# Patient Record
Sex: Female | Born: 1985 | Race: Black or African American | Hispanic: No | Marital: Single | Smoking: Current every day smoker
Health system: Southern US, Community
[De-identification: ages and names within clinical notes are randomized; demographics above are authoritative.]

## PROBLEM LIST (undated history)

## (undated) DIAGNOSIS — R569 Unspecified convulsions: Secondary | ICD-10-CM

## (undated) HISTORY — PX: OTHER SURGICAL HISTORY: SHX169

---
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
## 2004-01-29 ENCOUNTER — Inpatient Hospital Stay (HOSPITAL_COMMUNITY): Admission: AD | Admit: 2004-01-29 | Discharge: 2004-01-29 | Payer: Self-pay | Admitting: Obstetrics & Gynecology

## 2004-02-03 ENCOUNTER — Inpatient Hospital Stay (HOSPITAL_COMMUNITY): Admission: AD | Admit: 2004-02-03 | Discharge: 2004-02-03 | Payer: Self-pay | Admitting: *Deleted

## 2004-02-17 ENCOUNTER — Inpatient Hospital Stay (HOSPITAL_COMMUNITY): Admission: AD | Admit: 2004-02-17 | Discharge: 2004-02-21 | Payer: Self-pay | Admitting: Obstetrics and Gynecology

## 2004-02-17 ENCOUNTER — Ambulatory Visit: Payer: Self-pay | Admitting: Family Medicine

## 2004-02-18 ENCOUNTER — Encounter (INDEPENDENT_AMBULATORY_CARE_PROVIDER_SITE_OTHER): Payer: Self-pay | Admitting: Specialist

## 2004-11-02 ENCOUNTER — Ambulatory Visit (HOSPITAL_COMMUNITY): Admission: RE | Admit: 2004-11-02 | Discharge: 2004-11-02 | Payer: Self-pay | Admitting: *Deleted

## 2005-01-18 ENCOUNTER — Ambulatory Visit: Payer: Self-pay | Admitting: Family Medicine

## 2005-01-18 ENCOUNTER — Inpatient Hospital Stay (HOSPITAL_COMMUNITY): Admission: AD | Admit: 2005-01-18 | Discharge: 2005-01-18 | Payer: Self-pay | Admitting: *Deleted

## 2005-01-24 ENCOUNTER — Inpatient Hospital Stay (HOSPITAL_COMMUNITY): Admission: AD | Admit: 2005-01-24 | Discharge: 2005-01-27 | Payer: Self-pay | Admitting: *Deleted

## 2005-01-24 ENCOUNTER — Ambulatory Visit: Payer: Self-pay | Admitting: *Deleted

## 2005-01-25 ENCOUNTER — Encounter (INDEPENDENT_AMBULATORY_CARE_PROVIDER_SITE_OTHER): Payer: Self-pay | Admitting: Specialist

## 2005-01-31 ENCOUNTER — Inpatient Hospital Stay (HOSPITAL_COMMUNITY): Admission: AD | Admit: 2005-01-31 | Discharge: 2005-01-31 | Payer: Self-pay | Admitting: Obstetrics and Gynecology

## 2005-08-19 ENCOUNTER — Emergency Department (HOSPITAL_COMMUNITY): Admission: EM | Admit: 2005-08-19 | Discharge: 2005-08-20 | Payer: Self-pay | Admitting: Emergency Medicine

## 2005-12-30 ENCOUNTER — Emergency Department (HOSPITAL_COMMUNITY): Admission: EM | Admit: 2005-12-30 | Discharge: 2005-12-30 | Payer: Self-pay | Admitting: Family Medicine

## 2006-01-03 ENCOUNTER — Emergency Department (HOSPITAL_COMMUNITY): Admission: EM | Admit: 2006-01-03 | Discharge: 2006-01-03 | Payer: Self-pay | Admitting: Family Medicine

## 2006-04-18 DIAGNOSIS — E669 Obesity, unspecified: Secondary | ICD-10-CM

## 2006-09-30 ENCOUNTER — Encounter (INDEPENDENT_AMBULATORY_CARE_PROVIDER_SITE_OTHER): Payer: Self-pay | Admitting: *Deleted

## 2007-03-05 ENCOUNTER — Encounter (INDEPENDENT_AMBULATORY_CARE_PROVIDER_SITE_OTHER): Payer: Self-pay | Admitting: Family Medicine

## 2007-03-05 ENCOUNTER — Ambulatory Visit: Payer: Self-pay | Admitting: Family Medicine

## 2007-03-05 DIAGNOSIS — N898 Other specified noninflammatory disorders of vagina: Secondary | ICD-10-CM | POA: Insufficient documentation

## 2007-03-05 DIAGNOSIS — F17200 Nicotine dependence, unspecified, uncomplicated: Secondary | ICD-10-CM

## 2007-03-05 LAB — CONVERTED CEMR LAB

## 2007-03-06 ENCOUNTER — Telehealth (INDEPENDENT_AMBULATORY_CARE_PROVIDER_SITE_OTHER): Payer: Self-pay | Admitting: Family Medicine

## 2007-03-10 ENCOUNTER — Encounter (INDEPENDENT_AMBULATORY_CARE_PROVIDER_SITE_OTHER): Payer: Self-pay | Admitting: Family Medicine

## 2007-03-10 LAB — CONVERTED CEMR LAB: Pap Smear: NORMAL

## 2007-03-24 ENCOUNTER — Ambulatory Visit: Payer: Self-pay | Admitting: Family Medicine

## 2007-03-26 ENCOUNTER — Ambulatory Visit: Payer: Self-pay | Admitting: Family Medicine

## 2007-11-23 ENCOUNTER — Emergency Department (HOSPITAL_COMMUNITY): Admission: EM | Admit: 2007-11-23 | Discharge: 2007-11-23 | Payer: Self-pay | Admitting: Emergency Medicine

## 2007-12-27 ENCOUNTER — Emergency Department (HOSPITAL_COMMUNITY): Admission: EM | Admit: 2007-12-27 | Discharge: 2007-12-27 | Payer: Self-pay | Admitting: Emergency Medicine

## 2008-03-11 ENCOUNTER — Encounter: Payer: Self-pay | Admitting: Family Medicine

## 2008-03-11 ENCOUNTER — Other Ambulatory Visit: Admission: RE | Admit: 2008-03-11 | Discharge: 2008-03-11 | Payer: Self-pay | Admitting: Family Medicine

## 2008-03-11 ENCOUNTER — Ambulatory Visit: Payer: Self-pay | Admitting: Family Medicine

## 2008-03-11 LAB — CONVERTED CEMR LAB

## 2008-03-15 ENCOUNTER — Telehealth: Payer: Self-pay | Admitting: Family Medicine

## 2008-10-18 ENCOUNTER — Telehealth: Payer: Self-pay | Admitting: Family Medicine

## 2008-10-18 ENCOUNTER — Ambulatory Visit: Payer: Self-pay | Admitting: Family Medicine

## 2008-10-20 ENCOUNTER — Encounter: Payer: Self-pay | Admitting: *Deleted

## 2008-11-26 ENCOUNTER — Emergency Department (HOSPITAL_COMMUNITY): Admission: EM | Admit: 2008-11-26 | Discharge: 2008-11-26 | Payer: Self-pay | Admitting: Family Medicine

## 2009-01-12 ENCOUNTER — Ambulatory Visit: Payer: Self-pay | Admitting: Obstetrics and Gynecology

## 2009-09-06 ENCOUNTER — Emergency Department (HOSPITAL_COMMUNITY): Admission: EM | Admit: 2009-09-06 | Discharge: 2009-09-06 | Payer: Self-pay | Admitting: Emergency Medicine

## 2010-02-09 ENCOUNTER — Encounter: Payer: Self-pay | Admitting: Family Medicine

## 2010-03-23 NOTE — Miscellaneous (Signed)
  Clinical Lists Changes  Problems: Removed problem of INTRAUTERINE CONTRACEPTIVE DEVICE (ICD-V25.1) Removed problem of ROUTINE GYNECOLOGICAL EXAMINATION (ICD-V72.31) Removed problem of CONTACT OR EXPOSURE TO OTHER VIRAL DISEASES (ICD-V01.79) Removed problem of SCREENING FOR MALIGNANT NEOPLASM OF THE CERVIX (ICD-V76.2) Removed problem of SCREENING, PULMONARY TUBERCULOSIS (ICD-V74.1) Removed problem of SCREENING FOR DIABETES MELLITUS (ICD-V77.1) Removed problem of SCREENING FOR LIPOID DISORDERS (ICD-V77.91) Removed problem of HEALTHY ADULT FEMALE (ICD-V70.0)

## 2010-04-12 ENCOUNTER — Encounter: Payer: Self-pay | Admitting: *Deleted

## 2010-05-06 LAB — POCT PREGNANCY, URINE: Preg Test, Ur: NEGATIVE

## 2010-05-06 LAB — POCT I-STAT, CHEM 8
Calcium, Ion: 1.14 mmol/L (ref 1.12–1.32)
Creatinine, Ser: 0.8 mg/dL (ref 0.4–1.2)
Glucose, Bld: 87 mg/dL (ref 70–99)
HCT: 39 % (ref 36.0–46.0)
Hemoglobin: 13.3 g/dL (ref 12.0–15.0)
TCO2: 24 mmol/L (ref 0–100)

## 2010-05-25 LAB — POCT URINALYSIS DIP (DEVICE)
Bilirubin Urine: NEGATIVE
Glucose, UA: NEGATIVE mg/dL
Ketones, ur: NEGATIVE mg/dL
Nitrite: NEGATIVE
Protein, ur: NEGATIVE mg/dL

## 2010-05-25 LAB — WET PREP, GENITAL

## 2010-05-25 LAB — GC/CHLAMYDIA PROBE AMP, GENITAL: Chlamydia, DNA Probe: NEGATIVE

## 2010-07-07 NOTE — Discharge Summary (Signed)
NAME:  Kelly Johns, Kelly Johns               ACCOUNT NO.:  [id number]   MEDICAL RECORD NO.:  [id number]          PATIENT TYPE:  INP   LOCATION:  9131                          [facility]   PHYSICIAN:  Tanya S. Shawnie Pons, M.D.   DATE OF BIRTH:  1985/09/29   DATE OF ADMISSION:  02/17/2004  DATE OF DISCHARGE:  02/21/2004                                 DISCHARGE SUMMARY   ADDENDUM:  On discharge date, abdominal incision for the low transverse C-  section looked clean and dry; no signs of infection; no redness, induration,  or erythema.  Staples were removed, Steri-Strips placed.  Also, the patient  had a pediatric appointment for her baby at [facility] on February 28, 2004 at 10:30 a.m. with Dr. [place].  Also, the patient was instructed to  have pelvic rest for 6 weeks and to return to [facility] in 6 weeks for  postpartum check.      AM/MEDQ  D:  02/21/2004  T:  02/21/2004  Job:  [id number]

## 2010-07-07 NOTE — Discharge Summary (Signed)
NAME:  Kelly Johns, Kelly Johns               ACCOUNT NO.:  [id number]   MEDICAL RECORD NO.:  [id number]          PATIENT TYPE:  INP   LOCATION:  9131                          [facility]   PHYSICIAN:  Tanya S. Shawnie Pons, M.D.   DATE OF BIRTH:  29-Aug-1985   DATE OF ADMISSION:  02/17/2004  DATE OF DISCHARGE:  02/21/2004                                 DISCHARGE SUMMARY   ADMITTING DIAGNOSES:  1.  Intrauterine pregnancy at 40 weeks 5 days.  2.  Active labor with failure to progress.  3.  Nonreassuring fetal heart tracing.  4.  Delivery of a viable infant by low transverse cesarean section.   DISCHARGE MEDICATIONS:  1.  Ibuprofen 600 mg tablets one tablet q.6h. p.r.n. for pain.  2.  Percocet 325/5 mg one tablet q.4-6h. p.r.n. for increased pain.  3.  Ortho Tri-Cyclen one tablet daily as per packet instructions.  4.  Prenatal vitamins per 2-3 months.   ADMISSION HISTORY:  This is a 25 year old G1 patient with an intrauterine  pregnancy at 40 weeks 5 days admitted in active labor.  The patient  presented with slow progression and was placed in low-dose Pitocin and had  an artificial rupture of membranes at 1915 with a thick meconium.  The  patient received amnioinfusion.  Fetal heart tracing started to show  variable decelerations that became severe.  The baby remained reactive and  decelerations improved with the use of oxygen and position changes; also  with amnioinfusion and by turning the Pitocin off.  A thin anterior cervical  lip remained when the presentation was at the level of +2 station.  There  was an attempt to make the patient push and decelerations became more severe  and delivery was felt necessary to be eminent.  The patient was given a  terbutaline shot to stop her contractions.  Recurrent variables remained and  became more severe in the 40s.  The patient was taken to the OR to attempt a  vacuum extraction but in the OR the fetal heart rate remained below 100 and  in the 60s  with no reactivity and a STAT C-section was performed.  Please  see procedure dictation.  During labor, the patient was placed on penicillin  because she was GBS positive.  Also was placed on ampicillin, gentamycin,  and clindamycin after the C-section.  The patient received a total of 72  hours of antibiotics postprocedure and she presented postpartum fever on  postpartum day #2 of 100.4 but she had a good recovery.  She had minimal  pain and tenderness on postpartum day #1.  By postpartum day #2 and  postpartum day #3 the patient presented a well, benign course.  Postpartum  day #3 the patient was feeing well, ambulating, minimal pain and lochia.  She was over 48 afebrile.  She had a positive bowel movement earlier that  day.  The patient had received her RhoGAM shot.  Her vital signs were  temperature 97.9, heart rate 56, respirations 18, blood pressure 108/63.  Her physical exam showed general:  No acute distress, comfortable,  ambulating.  Cardiovascular:  Regular rate and rhythm; no murmurs, rubs, or  gallops.  Lungs:  Clear to auscultation bilaterally.  Extremities:  No calf  tenderness, 1+ edema in ankles, trace edema in the lower extremities but  better than the day prior . Abdomen was soft, expected tenderness,  nondistended, positive bowel sounds.  Uterus a finger below the umbilicus.  Her female baby was doing great.  He was circumcised earlier on the date of  discharge and was bottle feeding.  For contraception, this patient is going  to use Ortho Tri-Cylen.  A prescription was provided.   LABORATORY DATA:  The patient had an admission hemoglobin of 11.4.  On  discharge day her hemoglobin was 8.9.  On December 31 on postpartum day #1,  her white blood cell count was 13.7.  On the day prior discharge, her  white blood cell count was 9.4.  The patient had blood cultures that were  negative x2.  This patient's blood type is A negative.  Had a RhoGAM shot at  28 weeks of pregnancy  and before discharge of the hospital.  She was RPR  nonreactive, rubella immune, and GBS positive.  Mother and baby were  discharged home in stable condition.      AM/MEDQ  D:  02/21/2004  T:  02/21/2004  Job:  [id number]

## 2010-07-07 NOTE — Op Note (Signed)
NAME:  Kelly Johns, Kelly Johns               ACCOUNT NO.:  [id number]   MEDICAL RECORD NO.:  [id number]          PATIENT TYPE:  MAT   LOCATION:  MATC                          [facility]  [facility]   PHYSICIAN:  Conni Elliot, M.D.DATE OF BIRTH:  Mar 16, 1985   DATE OF PROCEDURE:  01/24/2005  DATE OF DISCHARGE:                                 OPERATIVE REPORT   PREOPERATIVE DIAGNOSIS:  Prior cesarean section, in active labor at term.   POSTOPERATIVE DIAGNOSIS:  Prior cesarean section, in active labor at term.   OPERATION/PROCEDURE:  Low transverse cesarean section, repeat.   SURGEON:  Conni Elliot, M.D.   ANESTHESIA:  Spinal.   DESCRIPTION OF PROCEDURE:  With the patient under spinal anesthetic in the  supine __________ position, abdomen was prepped and draped in the sterile  fashion.   The abdomen was entered through the prior cesarean scar and the old scar was  removed.  The incision was made through the subcutaneous tissue and fascia.  Peritoneal cavity entered.  Bladder flap created.  It should be noted that  the bladder flap was extremely high and skin fully reflected and will  possibly create problems in future surgery.  Lower uterine segment  transverse incision was made.  Fluid was meconium-stained and child was  DeLee suctioned prior to delivery of the chest.  The remainder of the body  was delivered.  Cord was doubly clamped and cut.  The baby was handed to the  neonatologist in attendance.  Placenta was delivered spontaneously.  The  uterus, bladder flap and anterior peritoneum and fascia were examined and  closed in a __________ fashion.  Estimated blood loss was less than 800 mL.           ______________________________  Conni Elliot, M.D.     ASG/MEDQ  D:  01/25/2005  T:  01/25/2005  Job:  [id number]

## 2010-07-07 NOTE — Discharge Summary (Signed)
NAME:  Kelly Johns, Kelly Johns               ACCOUNT NO.:  [id number]   MEDICAL RECORD NO.:  [id number]          PATIENT TYPE:  INP   LOCATION:  9129                          [facility]   PHYSICIAN:  Conni Elliot, M.D.DATE OF BIRTH:  [birth year]   DATE OF ADMISSION:  01/24/2005  DATE OF DISCHARGE:  01/27/2005                                 DISCHARGE SUMMARY   ADMISSION DIAGNOSES:  1.  Multipara with previous cesarean section desirous of elective repeat.  2.  Active labor at 39 weeks.   DISCHARGE DIAGNOSES:  1.  Repeat low transverse cesarean section under spinal anesthesia.  2.  Viable term infant.  3.  Rh negative, __________ negative.  4.  Mild anemia secondary to iron deficiency and blood loss.   HISTORY:  This is a 25 year old G2, P1-0-0-1 at 39 weeks on presentation  with intact membranes, no bleeding and good fetal movement.  She had been  contracting for several hours.   PREGNANCY COURSE:  Late onset of prenatal care but no complications; started  care at 27 weeks.   MEDICATIONS:  Prenatal vitamins.   ALLERGIES:  None.   OBSTETRICAL HISTORY:  She had a primary low transverse cesarean section for  nonreassuring fetal heart rate in December 2005.   GYNECOLOGIC HISTORY:  GYN history significant for history of Chlamydia, L-  SIL on Pap smear.   MEDICAL HISTORY:  Noncontributory.   SURGERIES:  LTCS under epidural.   FAMILY HISTORY:  Noncontributory.   SOCIAL HISTORY:  Smoker.  Partner not involved.   SIGNIFICANT LABORATORIES:  Rh negative, platelets 188, GBS was positive  November 15, 1-hour glucose 71, rubella immune, HBsAg negative, syphilis  negative, HIV nonreactive, GC negative, Chlamydia negative.   HOSPITAL COURSE:  Admission vital signs:  Afebrile, BP 124/68.  Exam:  She  was in no distress with some discomfort during contractions; lungs clear to  auscultation; heart RRR; abdomen term size; extremities trace edema; digital  exam significant for being 5  cm, 80%, -2 station, fetal heart rate was  reactive in the 150s.  The patient did not desire a trial of labor so  proceeded to low transverse cesarean section which was uncomplicated with  blood loss less than 1000.  She was put on cefotetan for two doses postop  and her placenta was sent for pathology, it was a mature placenta with three  vessel cord.  She had no postoperative complications.  On hospital day #3  she remained afebrile, fundus was firm and involuting, her pain was well  controlled with Percocet, she was bottle-feeding, she was discharged home on  the following medications:  Prenatal vitamin one a day, iron 325 mg one p.o.  twice a day, Percocet 5/325 one q.4-6 h p.r.n., ibuprofen 600 mg one p.o.  q.6 h p.r.n.   Her plan was abstinence until she was able to get her IUD at 6 weeks.  Followup was to be 6 weeks at [facility].      [name], C.N.M.    ______________________________  Conni Elliot, M.D.    DP/MEDQ  D:  02/13/2005  T:  02/13/2005  Job:  [id number]

## 2010-07-07 NOTE — Op Note (Signed)
NAME:  Wissink, Olivette               ACCOUNT NO.:  [id number]   MEDICAL RECORD NO.:  [id number]          PATIENT TYPE:  WOC   LOCATION:  WOC                          FACILITY:  [facility]   PHYSICIAN:  Tanya S. Shawnie Pons, M.D.   DATE OF BIRTH:  07/22/1985   DATE OF PROCEDURE:  02/18/2004  DATE OF DISCHARGE:                                 OPERATIVE REPORT   PREOPERATIVE DIAGNOSIS:  Intrauterine pregnancy at term, nonreassuring fetal  size.   POSTOPERATIVE DIAGNOSIS:  Intrauterine pregnancy at term, nonreassuring  fetal size.   OPERATION PERFORMED:  Primary low transverse cesarean section.   SURGEON:  Shelbie Proctor. Shawnie Pons, M.D.   ASSISTANT:  __________   ANESTHESIA:  Epidural.   ANESTHESIOLOGIST:  Octaviano Glow. Pamalee Leyden, M.D.   ESTIMATED BLOOD LOSS:  1000 mL.   COMPLICATIONS:  None.   SPECIMENS:  Placenta to pathology.   FINDINGS:  Viable female infant.  Apgars 8 and 9, weight 5 pounds, 9 ounces.   INDICATIONS FOR PROCEDURE:  Briefly, the patient is a 25 year old gravida 1  who is 40 and 5/7 weeks on admission, the day prior to surgery.  She was  admitted in labor and had slow progression.  She was started on Pitocin  augmentation; however, after AROM, thick meconium was noted and infusion was  started, but variable decels were noted as well.  These became more severe  and then would improve with change in position, oxygen, fluid challenge and  Pitocin off.  The patient progressed to a very thin anterior lip with the  baby at a +2 to +3.  An attempt was made to push.  However, the recurrent  decels became so severe that it was felt that immediate delivery was  necessary.  The patient was then given Terbutaline to stop her contractions;  however, they continued to come with severe variables lasting one and a half  to two minutes to the 40s.  The patient was taken to the operating room  where initially vacuum extraction was going to be attempted; however, once  the patient got to the operating  room the infants heart rate never got above  100 and it was having decels again down to the 60s and emergent C-section  was done.   DESCRIPTION OF PROCEDURE:  The patient was in the operating room in supine  position with left lateral tilt.  Epidural anesthesia had already been dosed  back in the delivery room.  The abdomen was splashed with Betadine and she  was draped.  An Lavena Bullion was used to test analgesia which was good and a knife  was used to make a Pfannenstiel incision in the skin.  This was carried down  to underlying fascia which was incised lengthwise.  The rectus was then  separated.  Peritoneal cavity entered.  Bladder blade inserted and a knife  used to make a low transverse incision on the uterus.  Amniotic fluid cavity  was entered and a very thin, low transverse segment of the uterus; meconium  stained fluid was noted.  The infant was wedged in the pelvis and was pulled  up and delivered with __________ still attached.  The infant had a tight  nuchal cord with the cord also around the feet.  The cord was clamped times  two and infant taken away by Peds.  The infant was resuscitated by peds and  had a cry by about 30 seconds of age.  The infant received blow by oxygen.  Cord pH was obtained.  Cord blood was obtained.  The placenta was delivered.  The uterus cleaned with dry lap pads.  Edges of the uterine incision grasped  with ring forceps and the uterine incision closed with a locked running 0  Vicryl suture.  A second imbricating layer was then used for hemostasis as  well as scar control for hemostasis.  Tubes and ovaries were observed and  found to be normal.  Hemostasis was felt to be adequate.  The uterine  incision and the fascia closed with 0 Vicryl suture in a running fashion.  Subcutaneous tissue was irrigated and any bleeding was cauterized with  electrocautery.  The skin was closed using clips.  10 mL of 0.25% Marcaine  was then injected into the patient's  abdomen.  An abdominal film was then  taken to assure that no instruments, needles or laps had been left inside  the patient as the procedure was done without a count.  The patient  tolerated the procedure well.  She was taken to recovery room in stable  condition.  The weight of the infant was 5 pounds 9 ounces.      TSP/MEDQ  D:  02/18/2004  T:  02/18/2004  Job:  [id number]

## 2011-01-17 ENCOUNTER — Encounter: Payer: Self-pay | Admitting: Family Medicine

## 2011-01-18 ENCOUNTER — Encounter: Payer: Self-pay | Admitting: Family Medicine

## 2011-01-18 ENCOUNTER — Other Ambulatory Visit (HOSPITAL_COMMUNITY)
Admission: RE | Admit: 2011-01-18 | Discharge: 2011-01-18 | Disposition: A | Discharge status: Home / Self Care | Payer: Medicaid Other | Source: Ambulatory Visit | Attending: Family Medicine | Admitting: Family Medicine

## 2011-01-18 ENCOUNTER — Ambulatory Visit (INDEPENDENT_AMBULATORY_CARE_PROVIDER_SITE_OTHER): Payer: Medicaid Other | Admitting: Family Medicine

## 2011-01-18 VITALS — BP 138/84 | HR 77 | Temp 98.7°F | Ht 63.0 in | Wt 197.3 lb

## 2011-01-18 DIAGNOSIS — N898 Other specified noninflammatory disorders of vagina: Secondary | ICD-10-CM

## 2011-01-18 DIAGNOSIS — Z01419 Encounter for gynecological examination (general) (routine) without abnormal findings: Secondary | ICD-10-CM | POA: Insufficient documentation

## 2011-01-18 DIAGNOSIS — Z0000 Encounter for general adult medical examination without abnormal findings: Secondary | ICD-10-CM

## 2011-01-18 DIAGNOSIS — F17200 Nicotine dependence, unspecified, uncomplicated: Secondary | ICD-10-CM

## 2011-01-18 DIAGNOSIS — Z124 Encounter for screening for malignant neoplasm of cervix: Secondary | ICD-10-CM

## 2011-01-18 DIAGNOSIS — R569 Unspecified convulsions: Secondary | ICD-10-CM

## 2011-01-18 DIAGNOSIS — Z23 Encounter for immunization: Secondary | ICD-10-CM

## 2011-01-18 DIAGNOSIS — G40909 Epilepsy, unspecified, not intractable, without status epilepticus: Secondary | ICD-10-CM

## 2011-01-18 LAB — POCT WET PREP (WET MOUNT)

## 2011-01-18 NOTE — Patient Instructions (Signed)
It was good to see today I am getting some blood work as well some imaging of the brain because of your seizures. I will like to also refer you to neurologist. We will check your cervical cultures as well as do some blood work. If any of your labs are positive or concerning we will contact you If you have any particular questions or concerns please give [place] a call Come back to see me in 1 month Call with any questions,  God Bless Doree Albee MD

## 2011-01-21 ENCOUNTER — Encounter: Payer: Self-pay | Admitting: Family Medicine

## 2011-01-21 DIAGNOSIS — Z0000 Encounter for general adult medical examination without abnormal findings: Secondary | ICD-10-CM | POA: Insufficient documentation

## 2011-01-21 DIAGNOSIS — R569 Unspecified convulsions: Secondary | ICD-10-CM | POA: Insufficient documentation

## 2011-01-21 NOTE — Assessment & Plan Note (Signed)
Wet prep, GC/Chl today. Will treat pending results.

## 2011-01-21 NOTE — Progress Notes (Signed)
  Subjective:    Patient ID: Kelly Johns, female    DOB: [birth year], 25 y.o.   MRN: [id number]  HPI Pt is here for annual exam and complaint of seizure  Seizure: Pt reports 4-5 episodes of seizres this year. Pt states that episodes have happened in there sleep, that have been winessed by her boyfriend. Pt states that these episodes occurred while she lived in [place]. Pt states that she never had episodes of seizure prior to this. Pt states that she was evaluated for this in a hospital in [place] (pt unsure of hospital). Pt unsure if she was placed on medication for this. + bladder incontinence assd with these episodes, + tongue biting. + post ictal confusion. Pt states that most recent episode was 1 month ago. Pt denies any recent head trauma, systemic infections. No neurological deficits. No known hx/o seizure d/o in the family.No ETOH, illicit drug use. Pt does report daily marijuana use.   From an annual exam standpoint, pt is currently sexual active with her boyfriend (who still lives in [place] area). Pt does report remote hx/o STDs. None recently. Pt does report mild vaginal discharge.   Review of Systems See HPI     Objective:   Physical Exam Gen: up in chair, NAD HEENT: NCAT, EOMI, PERRL, no photophobia on funduscopic exam, TMs clear bilaterally CV: RRR, no murmurs auscultated PULM: CTAB, no wheezes, rales, rhoncii ABD: S/NT/+ bowel sounds  GU: normal external genitalia, + vaginal discharge EXT: 2+ peripheral pulses NEURO: CN II-XII grossly intact, no focal neurological deficits.    Assessment & Plan:

## 2011-01-21 NOTE — Assessment & Plan Note (Signed)
Pt still smoking. Not ready to quit.  

## 2011-01-21 NOTE — Assessment & Plan Note (Addendum)
Relatively broad differential for this. Will obtain head CT to rule out intracranial process. Wil also check baseline labs. Formal referral to neuro. Advised pt to avoid driving. ? Marijuana is nidus for this. 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 Will follow up in 1-2 months.

## 2011-01-21 NOTE — Assessment & Plan Note (Signed)
Pap obtained.

## 2011-01-22 ENCOUNTER — Other Ambulatory Visit: Payer: Medicaid Other

## 2011-01-26 ENCOUNTER — Ambulatory Visit
Admission: RE | Admit: 2011-01-26 | Discharge: 2011-01-26 | Disposition: A | Discharge status: Home / Self Care | Payer: Medicaid Other | Source: Ambulatory Visit | Attending: Family Medicine | Admitting: Family Medicine

## 2011-01-26 DIAGNOSIS — G40909 Epilepsy, unspecified, not intractable, without status epilepticus: Secondary | ICD-10-CM

## 2011-01-26 MED ORDER — IOHEXOL 300 MG/ML  SOLN
75.0000 mL | Freq: Once | INTRAMUSCULAR | Status: AC | PRN
  Administered 2011-01-26: 75 mL via INTRAVENOUS

## 2011-01-29 ENCOUNTER — Encounter: Payer: Self-pay | Admitting: Family Medicine

## 2011-02-15 ENCOUNTER — Ambulatory Visit: Payer: Medicaid Other | Admitting: Family Medicine

## 2011-06-04 ENCOUNTER — Emergency Department (HOSPITAL_COMMUNITY)
Admission: EM | Admit: 2011-06-04 | Discharge: 2011-06-04 | Disposition: A | Discharge status: Home / Self Care | Payer: Self-pay | Attending: Emergency Medicine | Admitting: Emergency Medicine

## 2011-06-04 ENCOUNTER — Telehealth: Payer: Self-pay | Admitting: Family Medicine

## 2011-06-04 ENCOUNTER — Encounter (HOSPITAL_COMMUNITY): Payer: Self-pay | Admitting: *Deleted

## 2011-06-04 DIAGNOSIS — R569 Unspecified convulsions: Secondary | ICD-10-CM

## 2011-06-04 DIAGNOSIS — R51 Headache: Secondary | ICD-10-CM | POA: Insufficient documentation

## 2011-06-04 DIAGNOSIS — R110 Nausea: Secondary | ICD-10-CM | POA: Insufficient documentation

## 2011-06-04 HISTORY — DX: Unspecified convulsions: R56.9

## 2011-06-04 MED ORDER — ACETAMINOPHEN-CODEINE #3 300-30 MG PO TABS
1.0000 | ORAL_TABLET | Freq: Once | ORAL | Status: AC
  Administered 2011-06-04: 1 via ORAL
  Filled 2011-06-04: qty 1

## 2011-06-04 MED ORDER — LORAZEPAM 1 MG PO TABS
1.0000 mg | ORAL_TABLET | Freq: Once | ORAL | Status: AC
  Administered 2011-06-04: 1 mg via ORAL
  Filled 2011-06-04: qty 1

## 2011-06-04 MED ORDER — ONDANSETRON 8 MG PO TBDP
8.0000 mg | ORAL_TABLET | Freq: Once | ORAL | Status: AC
  Administered 2011-06-04: 8 mg via ORAL
  Filled 2011-06-04: qty 1

## 2011-06-04 NOTE — ED Provider Notes (Addendum)
History     CSN: [id number]  Arrival date & time 06/04/11  1191   First MD Initiated Contact with Patient 06/04/11 [phone]      Chief Complaint  Patient presents with  . Seizures    (Consider location/radiation/quality/duration/timing/severity/associated sxs/prior treatment) HPI Comments: Pt with reported long standing isue with seizures, had one yesterday.  Apparently had one in sleep this AM, only recalls rousing with paramedics around her and broght here.  Pt knows she is at [facility].  She has a HA and soreness to left side of tongue.  Mild nausea.  She denies recent fevers, vomiting, diarrhea, cough, cold symptoms.  She lives with a room mate.  She sees [facility], had a CT scan recently, was referred to neurologist, but pt reports has not yet seen.    Patient is a 26 y.o. female presenting with seizures. The history is provided by the patient and medical records.  Seizures  Associated symptoms include headaches and nausea. Pertinent negatives include no sore throat, no chest pain, no vomiting and no diarrhea.    Past Medical History  Diagnosis Date  . Seizures     History reviewed. No pertinent past surgical history.  History reviewed. No pertinent family history.  History  Substance Use Topics  . Smoking status: Current Everyday Smoker -- 0.1 packs/day    Types: Cigarettes  . Smokeless tobacco: Not on file  . Alcohol Use: No    OB History    Grav Para Term Preterm Abortions TAB SAB Ect Mult Living                  Review of Systems  Constitutional: Negative for fever and chills.  HENT: Negative for congestion, sore throat and rhinorrhea.   Cardiovascular: Negative for chest pain.  Gastrointestinal: Positive for nausea. Negative for vomiting, abdominal pain, diarrhea and constipation.  Musculoskeletal: Negative for back pain.  Neurological: Positive for seizures and headaches.  All other systems reviewed and are negative.    Allergies  Chocolate  Home Medications    No current outpatient prescriptions on file.  BP 127/75  Pulse 76  Temp(Src) 98.2 F (36.8 C) (Oral)  Resp 17  SpO2 96%  Physical Exam  Nursing note and vitals reviewed. Constitutional: She is oriented to person, place, and time. She appears well-developed and well-nourished. No distress.  HENT:  Head: Normocephalic and atraumatic.  Mouth/Throat: Uvula is midline and mucous membranes are normal. No oropharyngeal exudate.    Eyes: Conjunctivae and EOM are normal. Pupils are equal, round, and reactive to light. No scleral icterus.  Neck: Normal range of motion. Neck supple. No tracheal deviation present.  Cardiovascular: Normal rate.   Pulmonary/Chest: Effort normal.  Abdominal: Soft. She exhibits no distension. There is no tenderness. There is no guarding.  Musculoskeletal: She exhibits no edema and no tenderness.  Neurological: She is alert and oriented to person, place, and time.  Skin: Skin is warm. No rash noted.  Psychiatric: She has a normal mood and affect.    ED Course  Procedures (including critical care time)   Labs Reviewed  PREGNANCY, URINE   No results found.   1. Seizure     9:30 AM No further seizures, spoke to [facility] resident who reports pt can be worked into schedule today and then they can discuss use of antiepileptics there.  Pt cautioned not to drive until cleared.  1 mg PO ativan given here.    MDM  Pt is A&O times 3 currently, no  focal deficits on neuro examination at present.  I reviewed prior notes, will get Urine pregnancy, treat HA and monitor, help facilitate follow up, will discuss with [facility].           Gavin Pound. Oletta Lamas, MD 06/04/11 [id number]  Gavin Pound. Lynnleigh Soden, MD 06/04/11 0930

## 2011-06-04 NOTE — ED Notes (Signed)
OZH:YQMV<HQ> Expected date:06/04/11<BR> Expected time: 6:35 AM<BR> Means of arrival:Ambulance<BR> Comments:<BR> Seizure activity

## 2011-06-04 NOTE — ED Notes (Signed)
Per EMS - pt recently dx w/ seizure disorder - pt has seen a physician for this however has not yet been prescribed medications. Pt experienced a seizure this a.m. approx 0630 - pt postictal on EMS arrival.

## 2011-06-04 NOTE — Telephone Encounter (Signed)
[facility] EDP called because patient in ED s/p seizure. Responded to ativan. Not currently on any seizure medication. I do not see in her notes what she was on before and It appears she has not seen neurology yet. I advised the EDP to have patient follow up with [place] for a work in appointment today so we can address neurology follow up and seizure medications.

## 2011-06-04 NOTE — Discharge Instructions (Signed)
Please contact Dr. Pascola Blas office today to be worked in as a patient today to discuss options regarding seizure medications.

## 2011-06-05 ENCOUNTER — Ambulatory Visit (INDEPENDENT_AMBULATORY_CARE_PROVIDER_SITE_OTHER): Payer: Self-pay | Admitting: Family Medicine

## 2011-06-05 ENCOUNTER — Encounter: Payer: Self-pay | Admitting: Family Medicine

## 2011-06-05 VITALS — BP 128/82 | HR 97 | Ht 63.0 in | Wt 193.2 lb

## 2011-06-05 DIAGNOSIS — G40909 Epilepsy, unspecified, not intractable, without status epilepticus: Secondary | ICD-10-CM

## 2011-06-05 DIAGNOSIS — R569 Unspecified convulsions: Secondary | ICD-10-CM

## 2011-06-05 LAB — COMPREHENSIVE METABOLIC PANEL
Albumin: 3.8 g/dL (ref 3.5–5.2)
BUN: 8 mg/dL (ref 6–23)
CO2: 28 mEq/L (ref 19–32)
Calcium: 9.3 mg/dL (ref 8.4–10.5)
Glucose, Bld: 76 mg/dL (ref 70–99)
Potassium: 3.6 mEq/L (ref 3.5–5.3)
Sodium: 142 mEq/L (ref 135–145)
Total Protein: 7 g/dL (ref 6.0–8.3)

## 2011-06-05 NOTE — Patient Instructions (Signed)
Thank you for coming in today. We are doing urine and blood work today.  Please try to decrease marijuana use. Also, avoid driving, taking baths, and swimming pools. Be sure to get 8 hours of regular sleep.   We are referring you for an EEG and an MRI of your head. We will let you know of the results.  If you develop any worsening seizure disorder, or any other concerning symptoms, please call us or go to the ED.

## 2011-06-05 NOTE — Progress Notes (Addendum)
Subjective:     Patient ID: Kelly Johns, female   DOB: 11-21-1985, 26 y.o.   MRN: [id number]  HPI  Discharged from ED yesterday following witnessed seizure at 6am at her friend's home. This is her second seizure in two consecutive nights during sleep. She does not recall questioning by EMS during her second seizure and 'woke up in the hospital'. She had a significant headache yesterday. Prior to each event, she had a marijuana binge. No other illicit drug use. She often goes to bed late watching TV.   She bit her tongue with bleeding, no urinary/fecal incontinence. Did not hit her head during event.   For past year, has had seizures in sleep, every 2 months or so. Went to neurology, who did a CT that was unremarkable.  Pt denies any trauma, rashes, infection, or hx/o trauma.  Review of Systems     Objective:   Physical Exam Gen: Well appearing, NAD HEENT: NCAT, EOMI, PERRLA, abrasions to left lateral tongue. CV: RRR, no m/r/g Lungs: CTAB, normal WOB Neuro: CN II-XII intact in detail. Sensation grossly intact. 5/5 strength and full ROM in all extremities.    Assessment:         Plan:     [name] consecutive seizures are concerning for a secondary cause. She is a heavy marijuana smoker, which lowers her seizure threshold. Prior to each event, she has had marijuana binges. She was seen by neurology with a negative CT. ED evaluated only for upreg yesterday.  Will order an EEG and MRI of the head to r/o intracranial process. Ordered UDS, CMP, and CBC.  Patient advised to abstain as much as she can from marijuana use as it may be a precipitator. She was also advised to attempt to sleep 8-9 hours/night. She was counseled to not drive, bathe, or swim in pools until we can isolate a cause.      Pt seen and examined with [facility] and agree with above assessment and plan.

## 2011-06-06 ENCOUNTER — Encounter: Payer: Self-pay | Admitting: Family Medicine

## 2011-06-06 LAB — DRUG SCREEN, URINE
Benzodiazepines.: NEGATIVE
Cocaine Metabolites: POSITIVE — AB
Creatinine,U: 582.55 mg/dL
Phencyclidine (PCP): NEGATIVE
Propoxyphene: NEGATIVE

## 2011-06-06 LAB — CBC WITH DIFFERENTIAL/PLATELET
Basophils Relative: 0 % (ref 0–1)
HCT: 36.2 % (ref 36.0–46.0)
Hemoglobin: 12.2 g/dL (ref 12.0–15.0)
Lymphs Abs: 3.3 10*3/uL (ref 0.7–4.0)
MCH: 32.1 pg (ref 26.0–34.0)
MCHC: 33.7 g/dL (ref 30.0–36.0)
Monocytes Absolute: 0.7 10*3/uL (ref 0.1–1.0)
Monocytes Relative: 8 % (ref 3–12)
Neutro Abs: 4.1 10*3/uL (ref 1.7–7.7)
Neutrophils Relative %: 50 % (ref 43–77)
RBC: 3.8 MIL/uL — ABNORMAL LOW (ref 3.87–5.11)

## 2011-06-06 LAB — RPR

## 2011-06-06 LAB — HIV ANTIBODY (ROUTINE TESTING W REFLEX): HIV: NONREACTIVE

## 2011-06-06 NOTE — Assessment & Plan Note (Addendum)
Etiology seems likely to be secondary to illicit drug use as well persistent fatigue. Still, pt has not had an extensive work up for this. Will check CBC, CMET, HIV, RPR. Will also obtain MRI brain as well as EEG. Discussed marijuana avoidance as well as proper sleep hygiene. Discussed safety precautions including driving, swimming, and bathing avoidance pending work up. No medications prescribed pending work up. Follow up in 1-2 weeks. Neuro red flags discussed.  Case reviewed with Dr. Perley Jain

## 2011-06-08 ENCOUNTER — Inpatient Hospital Stay: Admission: RE | Admit: 2011-06-08 | Payer: Self-pay | Source: Ambulatory Visit

## 2011-09-02 ENCOUNTER — Emergency Department (HOSPITAL_COMMUNITY)
Admission: EM | Admit: 2011-09-02 | Discharge: 2011-09-03 | Disposition: A | Discharge status: Home / Self Care | Payer: Medicaid Other | Attending: Emergency Medicine | Admitting: Emergency Medicine

## 2011-09-02 ENCOUNTER — Emergency Department (HOSPITAL_COMMUNITY): Payer: Medicaid Other

## 2011-09-02 ENCOUNTER — Encounter (HOSPITAL_COMMUNITY): Payer: Self-pay | Admitting: Emergency Medicine

## 2011-09-02 DIAGNOSIS — S0180XA Unspecified open wound of other part of head, initial encounter: Secondary | ICD-10-CM | POA: Insufficient documentation

## 2011-09-02 DIAGNOSIS — R569 Unspecified convulsions: Secondary | ICD-10-CM | POA: Insufficient documentation

## 2011-09-02 DIAGNOSIS — S0181XA Laceration without foreign body of other part of head, initial encounter: Secondary | ICD-10-CM

## 2011-09-02 DIAGNOSIS — W19XXXA Unspecified fall, initial encounter: Secondary | ICD-10-CM | POA: Insufficient documentation

## 2011-09-02 LAB — CBC WITH DIFFERENTIAL/PLATELET
Basophils Absolute: 0 10*3/uL (ref 0.0–0.1)
Eosinophils Relative: 0 % (ref 0–5)
Lymphocytes Relative: 14 % (ref 12–46)
MCV: 93.4 fL (ref 78.0–100.0)
Neutro Abs: 9.6 10*3/uL — ABNORMAL HIGH (ref 1.7–7.7)
Neutrophils Relative %: 79 % — ABNORMAL HIGH (ref 43–77)
Platelets: 217 10*3/uL (ref 150–400)
RBC: 3.61 MIL/uL — ABNORMAL LOW (ref 3.87–5.11)
RDW: 14.3 % (ref 11.5–15.5)
WBC: 12.2 10*3/uL — ABNORMAL HIGH (ref 4.0–10.5)

## 2011-09-02 LAB — COMPREHENSIVE METABOLIC PANEL
ALT: 13 U/L (ref 0–35)
AST: 20 U/L (ref 0–37)
Alkaline Phosphatase: 71 U/L (ref 39–117)
CO2: 23 mEq/L (ref 19–32)
Calcium: 8.9 mg/dL (ref 8.4–10.5)
GFR calc non Af Amer: 78 mL/min — ABNORMAL LOW (ref >90)
Potassium: 3.3 mEq/L — ABNORMAL LOW (ref 3.5–5.1)
Sodium: 141 mEq/L (ref 135–145)

## 2011-09-02 MED ORDER — ONDANSETRON HCL 4 MG/2ML IJ SOLN
INTRAMUSCULAR | Status: AC
  Filled 2011-09-02: qty 2

## 2011-09-02 MED ORDER — SODIUM CHLORIDE 0.9 % IV BOLUS (SEPSIS)
1000.0000 mL | Freq: Once | INTRAVENOUS | Status: AC
  Administered 2011-09-02: 1000 mL via INTRAVENOUS

## 2011-09-02 MED ORDER — ONDANSETRON HCL 4 MG/2ML IJ SOLN
4.0000 mg | Freq: Once | INTRAMUSCULAR | Status: AC
  Administered 2011-09-02: 4 mg via INTRAVENOUS

## 2011-09-02 MED ORDER — TETANUS-DIPHTH-ACELL PERTUSSIS 5-2.5-18.5 LF-MCG/0.5 IM SUSP
0.5000 mL | Freq: Once | INTRAMUSCULAR | Status: AC
  Administered 2011-09-02: 0.5 mL via INTRAMUSCULAR
  Filled 2011-09-02: qty 0.5

## 2011-09-02 NOTE — ED Notes (Signed)
Patient has known history of seizures, EMS states patient was staying at friends house when she had a seizure causing her to fall down 1/2 flight of stairs. Patient combative on scene. Given 5 of Versed and restrained. Pt currently drowsy, in room asleep. Vitals stable. BP 168/100. Patient has laceration under eye, bleeding controlled.

## 2011-09-02 NOTE — ED Provider Notes (Signed)
History     CSN: [id number]  Arrival date & time 09/02/11  2202   First MD Initiated Contact with Patient 09/02/11 2202      Chief Complaint  Patient presents with  . Seizures    (Consider location/radiation/quality/duration/timing/severity/associated sxs/prior treatment) Patient is a 26 y.o. female presenting with seizures. The history is provided by the EMS personnel.  Seizures  This is a recurrent problem. The current episode started less than 1 hour ago. The problem has been resolved. There was 1 seizure. Duration: unknown. Associated symptoms include patient experiences confusion and headaches. Pertinent negatives include no sore throat, no chest pain, no cough, no nausea, no vomiting and no diarrhea. Characteristics include rhythmic jerking and loss of consciousness. Characteristics do not include apnea. The episode was witnessed. The seizures did not continue in the ED. nothing known There has been no fever. Meds prior to arrival: versed.    Past Medical History  Diagnosis Date  . Seizures     History reviewed. No pertinent past surgical history.  History reviewed. No pertinent family history.  History  Substance Use Topics  . Smoking status: Current Everyday Smoker -- 0.3 packs/day    Types: Cigarettes  . Smokeless tobacco: Not on file  . Alcohol Use: No    OB History    Grav Para Term Preterm Abortions TAB SAB Ect Mult Living                  Review of Systems  Constitutional: Negative for fever, chills, diaphoresis and fatigue.  HENT: Negative for ear pain, congestion, sore throat, facial swelling, mouth sores, trouble swallowing, neck pain and neck stiffness.   Eyes: Negative.   Respiratory: Negative for apnea, cough, chest tightness, shortness of breath and wheezing.   Cardiovascular: Negative for chest pain, palpitations and leg swelling.  Gastrointestinal: Negative for nausea, vomiting, abdominal pain, diarrhea and abdominal distention.  Genitourinary:  Negative for hematuria, flank pain, vaginal discharge, difficulty urinating and menstrual problem.  Musculoskeletal: Negative for back pain and gait problem.  Skin: Negative for rash and wound.  Neurological: Positive for seizures, loss of consciousness and headaches. Negative for dizziness, tremors, syncope, facial asymmetry and numbness.  Psychiatric/Behavioral: Positive for confusion and agitation.  All other systems reviewed and are negative.    Allergies  Chocolate  Home Medications  No current outpatient prescriptions on file.  There were no vitals taken for this visit.  Physical Exam  Nursing note and vitals reviewed. Constitutional: She appears well-developed and well-nourished. No distress.  HENT:  Head: Normocephalic. Head is with laceration.    Right Ear: External ear normal.  Left Ear: External ear normal.  Nose: Nose normal.  Mouth/Throat: Oropharynx is clear and moist. No oropharyngeal exudate.  Eyes: Conjunctivae and EOM are normal. Pupils are equal, round, and reactive to light. Right eye exhibits no discharge. Left eye exhibits no discharge.  Neck: Normal range of motion. Neck supple. No JVD present. No tracheal deviation present. No thyromegaly present.  Cardiovascular: Normal rate, regular rhythm, normal heart sounds and intact distal pulses.  Exam reveals no gallop and no friction rub.   No murmur heard. Pulmonary/Chest: Effort normal and breath sounds normal. No respiratory distress. She has no wheezes. She has no rales. She exhibits no tenderness.  Abdominal: Soft. Bowel sounds are normal. She exhibits no distension. There is no tenderness. There is no rebound and no guarding.  Musculoskeletal: Normal range of motion.  Lymphadenopathy:    She has no cervical adenopathy.  Neurological: She is alert. No cranial nerve deficit. Coordination normal.       Patient appears confused  Skin: Skin is warm. No rash noted. She is not diaphoretic.  Psychiatric: She has  a normal mood and affect. Her behavior is normal. Judgment and thought content normal.    ED Course  LACERATION REPAIR Date/Time: 09/02/2011 11:57 PM Performed by: Sherryl Manges Authorized by: Sherryl Manges Consent: Verbal consent obtained. Risks and benefits: risks, benefits and alternatives were discussed Consent given by: patient and parent Patient understanding: patient states understanding of the procedure being performed Patient consent: the patient's understanding of the procedure matches consent given Procedure consent: procedure consent matches procedure scheduled Relevant documents: relevant documents present and verified Patient identity confirmed: arm band and hospital-assigned identification number Time out: Immediately prior to procedure a "time out" was called to verify the correct patient, procedure, equipment, support staff and site/side marked as required. Body area: head/neck Location details: right cheek Laceration length: 3 cm Foreign bodies: no foreign bodies Tendon involvement: none Nerve involvement: none Vascular damage: no Anesthesia: local infiltration Local anesthetic: lidocaine 2% with epinephrine Anesthetic total: 3 ml Patient sedated: no Preparation: Patient was prepped and draped in the usual sterile fashion. Irrigation solution: saline Irrigation method: jet lavage Amount of cleaning: extensive Debridement: none Degree of undermining: none Skin closure: 6-0 nylon Number of sutures: 6 Technique: simple Approximation: close Approximation difficulty: simple Patient tolerance: Patient tolerated the procedure well with no immediate complications.   (including critical care time)   Labs Reviewed  CBC WITH DIFFERENTIAL  COMPREHENSIVE METABOLIC PANEL  URINALYSIS, ROUTINE W REFLEX MICROSCOPIC   No results found.   No diagnosis found.    MDM  26 year old female patient with known seizure disorder presents with seizure. Patient has been  having seizures for the past 2 years has seen a neurologist and MRI and EEG. Patient has not been placed on antiseizure medications point time. Patient's last seizure was approximately 3 months ago. According to EMS patient was at a friend's house walking up stairs when she fell on the stairs and started having jerking movements and loss of consciousness. Patient is now postictal and confused complaining of headache and was combative in the transport of her car and 5 of Versed. Patient with no cranial nerve deficit appreciable exam was able follow commands move all extremities with normal strength no abdominal pain. Patient without any recent fevers chest pain shortness of breath. Patient was nauseated and had an episode of emesis in the room. Given the fall and emesis concern for possible intracranial pathology will get head CT. Also get CMP CBC urine labs. We'll update tetanus and repair laceration.  Results for orders placed during the hospital encounter of 09/02/11  CBC WITH DIFFERENTIAL      Component Value Range   WBC 12.2 (*) 4.0 - 10.5 K/uL   RBC 3.61 (*) 3.87 - 5.11 MIL/uL   Hemoglobin 11.5 (*) 12.0 - 15.0 g/dL   HCT 29.5 (*) 28.4 - 13.2 %   MCV 93.4  78.0 - 100.0 fL   MCH 31.9  26.0 - 34.0 pg   MCHC 34.1  30.0 - 36.0 g/dL   RDW 44.0  10.2 - 72.5 %   Platelets 217  150 - 400 K/uL   Neutrophils Relative 79 (*) 43 - 77 %   Neutro Abs 9.6 (*) 1.7 - 7.7 K/uL   Lymphocytes Relative 14  12 - 46 %   Lymphs Abs 1.7  0.7 - 4.0  K/uL   Monocytes Relative 7  3 - 12 %   Monocytes Absolute 0.8  0.1 - 1.0 K/uL   Eosinophils Relative 0  0 - 5 %   Eosinophils Absolute 0.1  0.0 - 0.7 K/uL   Basophils Relative 0  0 - 1 %   Basophils Absolute 0.0  0.0 - 0.1 K/uL  COMPREHENSIVE METABOLIC PANEL      Component Value Range   Sodium 141  135 - 145 mEq/L   Potassium 3.3 (*) 3.5 - 5.1 mEq/L   Chloride 105  96 - 112 mEq/L   CO2 23  19 - 32 mEq/L   Glucose, Bld 113 (*) 70 - 99 mg/dL   BUN 8  6 - 23 mg/dL     Creatinine, Ser 4.54  0.50 - 1.10 mg/dL   Calcium 8.9  8.4 - 09.8 mg/dL   Total Protein 7.3  6.0 - 8.3 g/dL   Albumin 3.7  3.5 - 5.2 g/dL   AST 20  0 - 37 U/L   ALT 13  0 - 35 U/L   Alkaline Phosphatase 71  39 - 117 U/L   Total Bilirubin 0.2 (*) 0.3 - 1.2 mg/dL   GFR calc non Af Amer 78 (*) >90 mL/min   GFR calc Af Amer >90  >90 mL/min   CT Head Wo Contrast (Final result)   Result time:09/02/11 2306    Final result by Rad Results In Interface (09/02/11 23:06:31)    Narrative:   *RADIOLOGY REPORT*  Clinical Data: Seizure, fall  CT HEAD WITHOUT CONTRAST  Technique: Contiguous axial images were obtained from the base of the skull through the vertex without contrast.  Comparison: 01/26/2011  Findings: Opacification of the right maxillary sinus partly visualized. Minimal motion artifact. No acute hemorrhage, acute infarction, or mass lesion is identified. No midline shift. No ventriculomegaly. No skull fracture.  IMPRESSION: No acute intracranial finding.  Original Report Authenticated By: Harrel Lemon, M.D.     Patient was normal laboratory work and normal head CT. Patient is oriented and conversive but somewhat drowsy and confused with some perseverating questions. We'll reevaluate if patient approaches her baseline mental status and we'll have her followup with neurology for better control of her seizures.  Case discussed with Dr. Sharlot Gowda, MD 09/02/11 [phone]

## 2011-09-02 NOTE — ED Notes (Signed)
Dr. at bedside with suture cart. Preparing to suture laceration. Patient alertx4, NAD at this point.

## 2011-09-02 NOTE — ED Notes (Signed)
Patient is aroused to her name. Some repetitive questions, but patient is increasingly becoming more alert. Mother at bedside. NAD.

## 2011-09-02 NOTE — ED Notes (Signed)
I gave the patient two warm blankets. 

## 2011-09-03 ENCOUNTER — Telehealth: Payer: Self-pay | Admitting: Family Medicine

## 2011-09-03 NOTE — Telephone Encounter (Signed)
Mom calling because pt has been in the hospital again with seizures and is requesting a referral to [facility] ASAP!! Pt has medicaid. She can be reached at [phone]

## 2011-09-04 NOTE — Telephone Encounter (Signed)
Called and lvm with mom to inform her that pt was to have followed up and did not do so. Dr. Louanne Belton is asking that pt schedule an office visit prior to referral being placed.Kelly Johns [place]

## 2011-09-04 NOTE — Telephone Encounter (Signed)
Patient needs to be seen.  She appears to have been non-compliant with plan from previous office visit (MRI/EEG) and did not follow up in 2 weeks as requested.

## 2011-09-06 NOTE — ED Provider Notes (Signed)
I saw and evaluated the patient, reviewed the resident's note and I agree with the findings and plan.  25yF with likely. Reports hx of same but not on meds. Pt back to baseline. W/u fairly unremarkable. My neuro exam was nonfocal. Lungs clear. Heart RRR w/o murmur. Facial lacerations. Larger lac repaired by Dr Lew Dawes. On my exam smaller laceration above this still bleeding and repaired by myself. Amenable to dermabond given small size, linear nature and low tension. Pt needs neurology FU. Given repeat seizures may benefit from meds.  Raeford Razor, MD 09/06/11 [id number]

## 2011-09-07 ENCOUNTER — Encounter: Payer: Self-pay | Admitting: Family Medicine

## 2011-09-07 ENCOUNTER — Ambulatory Visit (INDEPENDENT_AMBULATORY_CARE_PROVIDER_SITE_OTHER): Payer: Self-pay | Admitting: Family Medicine

## 2011-09-07 VITALS — BP 129/82 | HR 78 | Ht 63.0 in | Wt 184.0 lb

## 2011-09-07 DIAGNOSIS — R569 Unspecified convulsions: Secondary | ICD-10-CM

## 2011-09-07 DIAGNOSIS — M62838 Other muscle spasm: Secondary | ICD-10-CM

## 2011-09-07 DIAGNOSIS — F1910 Other psychoactive substance abuse, uncomplicated: Secondary | ICD-10-CM | POA: Insufficient documentation

## 2011-09-07 MED ORDER — IBUPROFEN 800 MG PO TABS
800.0000 mg | ORAL_TABLET | Freq: Three times a day (TID) | ORAL | Status: AC | PRN
Start: 2011-09-07 — End: 2011-09-17

## 2011-09-07 NOTE — Assessment & Plan Note (Signed)
Will reschedule EEG and MRI previously ordered by PCP.  Will also place another referral to neurology.  Gave precautions against driving, swimming, other behaviors that could be dangerous in events of seizure.

## 2011-09-07 NOTE — Assessment & Plan Note (Signed)
Ibuprofen and supportive care.

## 2011-09-07 NOTE — Patient Instructions (Addendum)
Ordered MRI and EEG Will refer you to neurology Avoid driving, swimming alone, other activities that would be harmful if you had another seizure  Cervical Sprain A cervical sprain is when the ligaments in the neck stretch or tear. The ligaments are the tissues that hold the neck bones in place. HOME CARE    Put ice on the injured area.   Put ice in a plastic bag.   Place a towel between your skin and the bag.   Leave the ice on for 15 to 20 minutes, 3 to 4 times a day.   Only take medicine as told by your doctor.   Keep all doctor visits as told.   Keep all physical therapy visits as told.   If your doctor gives you a neck collar, wear it as told.   Do not drive while wearing a neck collar.   Adjust your work station so that you have good posture while you work.   Avoid positions and activities that make your problems worse.   Warm up and stretch before being active.  GET HELP RIGHT AWAY IF:    You are bleeding or your stomach is upset.   You have an allergic reaction to your medicine.   Your problems (symptoms) get worse.   You develop new problems.   You lose feeling (numbness) or you cannot move (paralysis) any part of your body.   You have tingling or weakness in any part of your body.   Your pain is not controlled with medicine.   You cannot take less pain medicine over time as planned.   Your activity level does not improve as expected.  MAKE SURE YOU:    Understand these instructions.   Will watch your condition.   Will get help right away if you are not doing well or get worse.  Document Released: 07/25/2007 Document Revised: 01/25/2011 Document Reviewed: 11/09/2010 [facility] Patient Information 2012 [place], [place].

## 2011-09-07 NOTE — Progress Notes (Signed)
  Subjective:    Patient ID: Kelly Johns, female    DOB: [birth year], 26 y.o.   MRN: [id number]  HPI Patient presents for ER follow-up for seizure 5 days ago- brought by her mother.  She does not remember circumstances.  Her 8 yo daughter found her in bathroom.  Has hit her head, was taken  To ER.  CT head normal.  Laceration or right cheekbone repaired.  Neck arm arm sore where there are bruises.  Denies any trigger such as fever or infection (but she has dental caries) In past has been related to substance abuse or lack of sleep per notes.  Did not inquire today.  Per patient states has had seizures for 2 years.  Last one was April.  The one before that was a few months prior.  States they are usually characterized by onset while sleeping, urinary incontinence, and abnormal movements.  Has missed appointment for EEG, MRI and neurology referral in the past.  See notes below  from Dr. Alvester Morin for full details.  April 2013: Discharged from ED yesterday following witnessed seizure at 6am at her friend's home. This is her second seizure in two consecutive nights during sleep. She does not recall questioning by EMS during her second seizure and 'woke up in the hospital'. She had a significant headache yesterday. Prior to each event, she had a marijuana binge. No other illicit drug use. She often goes to bed late watching TV.  She bit her tongue with bleeding, no urinary/fecal incontinence. Did not hit her head during event.  For past year, has had seizures in sleep, every 2 months or so. Went to neurology, who did a CT that was unremarkable.  November 2011: Seizure: Pt reports 4-5 episodes of seizres this year. Pt states that episodes have happened in there sleep, that have been winessed by her boyfriend. Pt states that these episodes occurred while she lived in [place]. Pt states that she never had episodes of seizure prior to this. Pt states that she was evaluated for this in a hospital in  [place] (pt unsure of hospital). Pt unsure if she was placed on medication for this. + bladder incontinence assd with these episodes, + tongue biting. + post ictal confusion. Pt states that most recent episode was 1 month ago. Pt denies any recent head trauma, systemic infections. No neurological deficits. No known hx/o seizure d/o in the family.No ETOH, illicit drug use. Pt does report daily marijuana use.    Review of Systems See HPI    Objective:   Physical Exam GEN: Alert & Oriented, No acute distress CV:  Regular Rate & Rhythm, no murmur Respiratory:  Normal work of breathing, CTAB Abd:  + BS, soft, no tenderness to palpation Ext: no pre-tibial edema NecK;  Right side muscle spasm, decreased flexibility.. No spinal tenderness 3 black sutures removed from well healing laceration on right cheek.  Bruising noted under eyes.        Assessment & Plan:

## 2011-09-14 ENCOUNTER — Ambulatory Visit
Admission: RE | Admit: 2011-09-14 | Discharge: 2011-09-14 | Disposition: A | Discharge status: Home / Self Care | Payer: Self-pay | Source: Ambulatory Visit | Attending: Family Medicine | Admitting: Family Medicine

## 2011-09-14 DIAGNOSIS — G40909 Epilepsy, unspecified, not intractable, without status epilepticus: Secondary | ICD-10-CM

## 2011-09-14 MED ORDER — GADOBENATE DIMEGLUMINE 529 MG/ML IV SOLN
17.0000 mL | Freq: Once | INTRAVENOUS | Status: AC | PRN
  Administered 2011-09-14: 17 mL via INTRAVENOUS

## 2011-10-16 ENCOUNTER — Encounter: Payer: Self-pay | Admitting: Family Medicine

## 2012-02-25 ENCOUNTER — Ambulatory Visit: Payer: Self-pay

## 2012-02-26 ENCOUNTER — Telehealth: Payer: Self-pay | Admitting: *Deleted

## 2012-02-26 ENCOUNTER — Ambulatory Visit (INDEPENDENT_AMBULATORY_CARE_PROVIDER_SITE_OTHER): Payer: Medicaid Other | Admitting: Sports Medicine

## 2012-02-26 ENCOUNTER — Encounter: Payer: Self-pay | Admitting: Sports Medicine

## 2012-02-26 VITALS — BP 151/88 | HR 75 | Temp 98.3°F | Ht 63.0 in | Wt 193.2 lb

## 2012-02-26 DIAGNOSIS — F1910 Other psychoactive substance abuse, uncomplicated: Secondary | ICD-10-CM

## 2012-02-26 DIAGNOSIS — S01502A Unspecified open wound of oral cavity, initial encounter: Secondary | ICD-10-CM

## 2012-02-26 DIAGNOSIS — S01512A Laceration without foreign body of oral cavity, initial encounter: Secondary | ICD-10-CM

## 2012-02-26 DIAGNOSIS — R569 Unspecified convulsions: Secondary | ICD-10-CM

## 2012-02-26 MED ORDER — LEVETIRACETAM 500 MG PO TABS: 500.0000 mg | ORAL_TABLET | Freq: Two times a day (BID) | ORAL | Status: DC

## 2012-02-26 NOTE — Assessment & Plan Note (Signed)
Issues with non-compliance for appointments.  Unable to be seen @ [facility] neurology Will refer to Neurology in [place] Will start Keppra today given multiple recurrent seizures, evidence of lateral tongue laceration on exam today and reports q 2-3 weeks seizures. Encouraged to stop Marijuana use given high abuse.  Will need to follow up with PCP to address mood disorder >Pt may benefit from mood stabilizer like Depakote for seizures but will defer this at this time due to prior hx of non-compliance

## 2012-02-26 NOTE — Patient Instructions (Addendum)
It was nice to see you today.   Today we discussed: 1. Seizure  Please make sure you go to your neurology Appointment  - Ambulatory referral to Neurology  - levETIRAcetam (KEPPRA) 500 MG tablet; Take 1 tablet (500 mg total) by mouth every 12 (twelve) hours.  Dispense: 60 tablet; Refill: 2  Stop smoking Marijuana.    Please plan to return to see Dr. Louanne Belton in 4-6 weeks to further discuss your ongoing mood and anxiety issues.  If you need anything prior to seeing me please call the clinic.  Please Bring all medications with you to each appointment.

## 2012-02-26 NOTE — Telephone Encounter (Signed)
LVM for patient to call back. Neurology appointment set for Wednesday 1/15 @ 9:30am with Dr. Kerin Perna [facility] [address]. [id number] PATIENT NEEDS TO BE THERE BY 9:15AM WITH MEDICAID CARD AND ID.

## 2012-02-26 NOTE — Telephone Encounter (Signed)
Patient came back in and was given information

## 2012-02-29 DIAGNOSIS — S01512A Laceration without foreign body of oral cavity, initial encounter: Secondary | ICD-10-CM | POA: Insufficient documentation

## 2012-02-29 NOTE — Assessment & Plan Note (Addendum)
See above.  Encouraged and counseled to stop potentially eliciting causes of seizures.  Reports using as means of stress reduction

## 2012-02-29 NOTE — Progress Notes (Signed)
  [facility]  Patient name: Kelly Johns MRN [id number]  Date of birth: August 03, 1985  CC & HPI:  Kelly Johns is a 27 y.o. female presenting today for evaluation for seizures.  # reports last seizure 3 days ago.  Been having q 2-3 weeks .  Has never seen neurology.  EMS has been called multiple times with only occasional transfer to ED.  Never last >5 minutes.  Not taking any medicaitons.  Has [facility] at [facility].    Tonic/Clonic seizures.  Loss of continence. Most recent seizure caused lateral tongue laceration  # Substance abuse.  Reports daily / multiple times daily marijuana use.  Denies other substances at this time but hx of cocaine and opiates.  Does associate marijuana binges with seizures  ROS:  Per HPI  Pertinent History Reviewed:  Medical & Surgical Hx:  Reviewed: Significant for recurrent seizures and substance abuse Medications: Reviewed & Updated - see associated section Social History: Reviewed -  reports that she has been smoking Cigarettes.  She has been smoking about .3 packs per day. She does not have any smokeless tobacco history on file.   Objective Findings:  Vitals:  Filed Vitals:   02/26/12 1142  BP: 151/88  Pulse: 75  Temp: 98.3 F (36.8 C)    PE: GENERAL:  Adult obese female. In no discomfort; no respiratory distress. PSYCH: Alert and appropriately interactive; Insight:Lacking and Shallow   H&N: AT/[place], trachea midline, large healing left lateral tongue laceration, not bleeding. EENT:  MMM, no scleral icterus, EOMi HEART: RRR, S1/S2 heard, no murmur LUNGS: CTA B, no wheezes, no crackles EXTREMITIES: Moves all 4 extremities spontaneously, warm well perfused, no edema, bilateral DP and PT pulses 2/4.   NEURO: CN II-XI intact.  No nystagmus on exam. PERRLA.  UE and LE myotomes 5+/5 with 2+/4 DTRs. gross Sensation intact.  FNF without dysmetria    Assessment & Plan:

## 2012-02-29 NOTE — Assessment & Plan Note (Signed)
Healing well, does not need stiches, >48 hours out Needs seizures controlled.

## 2012-03-05 ENCOUNTER — Ambulatory Visit: Payer: Medicaid Other | Admitting: Neurology

## 2012-03-14 ENCOUNTER — Ambulatory Visit (INDEPENDENT_AMBULATORY_CARE_PROVIDER_SITE_OTHER): Payer: Medicaid Other | Admitting: Neurology

## 2012-03-14 ENCOUNTER — Encounter: Payer: Self-pay | Admitting: Neurology

## 2012-03-14 VITALS — BP 120/76 | HR 70 | Temp 98.1°F | Resp 12 | Ht 63.0 in | Wt 194.0 lb

## 2012-03-14 DIAGNOSIS — G40309 Generalized idiopathic epilepsy and epileptic syndromes, not intractable, without status epilepticus: Secondary | ICD-10-CM

## 2012-03-14 MED ORDER — LAMOTRIGINE 25 MG PO TABS: 25.0000 mg | ORAL_TABLET | Freq: Two times a day (BID) | ORAL | Status: DC
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
## 2012-03-14 NOTE — Patient Instructions (Addendum)
Follow up in our office in 3 weeks.  Start your new medication as prescribed.  No driving.

## 2012-04-04 ENCOUNTER — Ambulatory Visit: Payer: Medicaid Other | Admitting: Neurology

## 2012-04-07 ENCOUNTER — Telehealth: Payer: Self-pay | Admitting: Neurology

## 2012-04-07 NOTE — Telephone Encounter (Signed)
Spoke w/ pt to cancel Friday appt d/t weather. Per pt she has had two sz last week. / Sherri S.

## 2012-04-07 NOTE — Telephone Encounter (Signed)
Called and spoke with the patient. She reports sz x 2 during the night recently. She is on Lamictal "2 pills two times a day." (50 mg BID). She is scheduled to f/u here with Dr. Smiley Houseman this Wednesday, Feb. 19th. She states she will be here. She did stop the Keppra when she started the Lamictal (not sure she was to stop it) and thus this may be the reason for her recent nocturnal sz. States she is tolerating the Lamictal without ill effects. Urged to continue her meds until seen here on Wed. The patient states she will. **Dr. Smiley Houseman, just a FYI...Marland KitchenMarland Kitchen

## 2012-04-09 ENCOUNTER — Ambulatory Visit: Payer: Medicaid Other | Admitting: Neurology

## 2012-04-09 ENCOUNTER — Other Ambulatory Visit: Payer: Self-pay | Admitting: Neurology

## 2012-04-09 DIAGNOSIS — G40309 Generalized idiopathic epilepsy and epileptic syndromes, not intractable, without status epilepticus: Secondary | ICD-10-CM

## 2012-04-09 MED ORDER — LAMOTRIGINE 100 MG PO TABS: 100.0000 mg | ORAL_TABLET | Freq: Two times a day (BID) | ORAL | Status: DC

## 2012-09-16 ENCOUNTER — Emergency Department (HOSPITAL_COMMUNITY)
Admission: EM | Admit: 2012-09-16 | Discharge: 2012-09-16 | Disposition: A | Discharge status: Home / Self Care | Payer: Medicaid Other | Attending: Emergency Medicine | Admitting: Emergency Medicine

## 2012-09-16 ENCOUNTER — Encounter (HOSPITAL_COMMUNITY): Payer: Self-pay | Admitting: *Deleted

## 2012-09-16 DIAGNOSIS — X58XXXA Exposure to other specified factors, initial encounter: Secondary | ICD-10-CM | POA: Insufficient documentation

## 2012-09-16 DIAGNOSIS — R110 Nausea: Secondary | ICD-10-CM | POA: Insufficient documentation

## 2012-09-16 DIAGNOSIS — R51 Headache: Secondary | ICD-10-CM | POA: Insufficient documentation

## 2012-09-16 DIAGNOSIS — G40909 Epilepsy, unspecified, not intractable, without status epilepticus: Secondary | ICD-10-CM

## 2012-09-16 DIAGNOSIS — R569 Unspecified convulsions: Secondary | ICD-10-CM | POA: Insufficient documentation

## 2012-09-16 DIAGNOSIS — S01502A Unspecified open wound of oral cavity, initial encounter: Secondary | ICD-10-CM | POA: Insufficient documentation

## 2012-09-16 DIAGNOSIS — Y929 Unspecified place or not applicable: Secondary | ICD-10-CM | POA: Insufficient documentation

## 2012-09-16 DIAGNOSIS — F17200 Nicotine dependence, unspecified, uncomplicated: Secondary | ICD-10-CM | POA: Insufficient documentation

## 2012-09-16 DIAGNOSIS — Y939 Activity, unspecified: Secondary | ICD-10-CM | POA: Insufficient documentation

## 2012-09-16 DIAGNOSIS — S01512A Laceration without foreign body of oral cavity, initial encounter: Secondary | ICD-10-CM

## 2012-09-16 DIAGNOSIS — Z3202 Encounter for pregnancy test, result negative: Secondary | ICD-10-CM | POA: Insufficient documentation

## 2012-09-16 LAB — COMPREHENSIVE METABOLIC PANEL
ALT: 12 U/L (ref 0–35)
AST: 15 U/L (ref 0–37)
Albumin: 3.6 g/dL (ref 3.5–5.2)
Calcium: 9.2 mg/dL (ref 8.4–10.5)
Chloride: 103 mEq/L (ref 96–112)
Creatinine, Ser: 0.95 mg/dL (ref 0.50–1.10)
Sodium: 137 mEq/L (ref 135–145)

## 2012-09-16 LAB — CBC WITH DIFFERENTIAL/PLATELET
Basophils Absolute: 0 10*3/uL (ref 0.0–0.1)
Basophils Relative: 0 % (ref 0–1)
Eosinophils Absolute: 0 10*3/uL (ref 0.0–0.7)
Eosinophils Relative: 0 % (ref 0–5)
HCT: 35.3 % — ABNORMAL LOW (ref 36.0–46.0)
Lymphocytes Relative: 17 % (ref 12–46)
MCH: 32.2 pg (ref 26.0–34.0)
MCHC: 34.8 g/dL (ref 30.0–36.0)
MCV: 92.4 fL (ref 78.0–100.0)
Monocytes Absolute: 0.6 10*3/uL (ref 0.1–1.0)
RDW: 14.5 % (ref 11.5–15.5)

## 2012-09-16 LAB — URINALYSIS, ROUTINE W REFLEX MICROSCOPIC
Bilirubin Urine: NEGATIVE
Hgb urine dipstick: NEGATIVE
Ketones, ur: 15 mg/dL — AB
Protein, ur: NEGATIVE mg/dL
Urobilinogen, UA: 0.2 mg/dL (ref 0.0–1.0)

## 2012-09-16 LAB — ETHANOL: Alcohol, Ethyl (B): <11 mg/dL (ref 0–11)

## 2012-09-16 LAB — GLUCOSE, CAPILLARY: Comment 3: 4825497

## 2012-09-16 LAB — RAPID URINE DRUG SCREEN, HOSP PERFORMED
Barbiturates: NOT DETECTED
Benzodiazepines: NOT DETECTED
Cocaine: NOT DETECTED
Tetrahydrocannabinol: POSITIVE — AB

## 2012-09-16 MED ORDER — LEVETIRACETAM 500 MG PO TABS
1000.0000 mg | ORAL_TABLET | Freq: Once | ORAL | Status: AC
  Administered 2012-09-16: 1000 mg via ORAL
  Filled 2012-09-16 (×2): qty 2

## 2012-09-16 MED ORDER — ONDANSETRON HCL 4 MG/2ML IJ SOLN
4.0000 mg | Freq: Once | INTRAMUSCULAR | Status: AC
  Administered 2012-09-16: 4 mg via INTRAVENOUS
  Filled 2012-09-16: qty 2

## 2012-09-16 MED ORDER — IBUPROFEN 800 MG PO TABS
800.0000 mg | ORAL_TABLET | Freq: Once | ORAL | Status: AC
  Administered 2012-09-16: 800 mg via ORAL
  Filled 2012-09-16: qty 1

## 2012-09-16 NOTE — ED Notes (Signed)
CBG 98  

## 2012-09-16 NOTE — Discharge Instructions (Signed)
1. Medications: Increase your Keppra to 1000 mg twice per day; usual home medications 2. Treatment: rest, drink plenty of fluids, take medications as prescribed 3. Follow Up: Please followup with your primary doctor for discussion of your diagnoses and further evaluation after today's visit; followup with your neurologist for further discussion of your seizure management   Epilepsy A seizure (convulsion) is a sudden change in brain function that causes a change in behavior, muscle activity, or ability to remain awake and alert. If a person has recurring seizures, this is called epilepsy. CAUSES  Epilepsy is a disorder with many possible causes. Anything that disturbs the normal pattern of brain cell activity can lead to seizures. Seizure can be caused from illness to brain damage to abnormal brain development. Epilepsy may develop because of:  An abnormality in brain wiring.  An imbalance of nerve signaling chemicals (neurotransmitters).  Some combination of these factors. Scientists are learning an increasing amount about genetic causes of seizures. SYMPTOMS  The symptoms of a seizure can vary greatly from one person to another. These may include:  An aura, or warning that tells a person they are about to have a seizure.  Abnormal sensations, such as abnormal smell or seeing flashing lights.  Sudden, general body stiffness.  Rhythmic jerking of the face, arm, or leg  on one or both sides.  Sudden change in consciousness.  The person may appear to be awake but not responding.  They may appear to be asleep but cannot be awakened.  Grimacing, chewing, lip smacking, or drooling.  Often there is a period of sleepiness after a seizure. DIAGNOSIS  The description you give to your caregiver about what you experienced will help them understand your problems. Equally important is the description by any witnesses to your seizure. A physical exam, including a detailed neurological exam, is  necessary. An EEG (electroencephalogram) is a painless test of your brain waves. In this test a diagram is created of your brain waves. These diagrams can be interpreted by a specialist. Pictures of your brain are usually taken with:  An MRI.  A CT scan. Lab tests may be done to look for:  Signs of infection.  Abnormal blood chemistry. PREVENTION  There is no way to prevent the development of epilepsy. If you have seizures that are typically triggered by an event (such as flashing lights), try to avoid the trigger. This can help you avoid a seizure.  PROGNOSIS  Most people with epilepsy lead outwardly normal lives. While epilepsy cannot currently be cured, for some people it does eventually go away. Most seizures do not cause brain damage. It is not uncommon for people with epilepsy, especially children, to develop behavioral and emotional problems. These problems are sometimes the consequence of medicine for seizures or social stress. For some people with epilepsy, the risk of seizures restricts their independence and recreational activities. For example, some states refuse drivers licenses to people with epilepsy. Most women with epilepsy can become pregnant. They should discuss their epilepsy and the medicine they are taking with their caregivers. Women with epilepsy have a 90 percent or better chance of having a normal, healthy baby. RISKS AND COMPLICATIONS  People with epilepsy are at increased risk of falls, accidents, and injuries. People with epilepsy are at special risk for two life-threatening conditions. These are status epilepticus and sudden unexplained death (extremely rare). Status epilepticus is a long lasting, continuous seizure that is a medical emergency. TREATMENT  Once epilepsy is diagnosed, it is  important to begin treatment as soon as possible. For about 80 percent of those diagnosed with epilepsy, seizures can be controlled with modern medicines and surgical techniques. Some  antiepileptic drugs can interfere with the effectiveness of oral contraceptives. In 1997, the FDA approved a pacemaker for the brain the (vagus nerve stimulator). This stimulator can be used for people with seizures that are not well-controlled by medicine. Studies have shown that in some cases, children may experience fewer seizures if they maintain a strict diet. The strict diet is called the ketogenic diet. This diet is rich in fats and low in carbohydrates. HOME CARE INSTRUCTIONS   Your caregiver will make recommendations about driving and safety in normal activities. Follow these carefully.  Take any medicine prescribed exactly as directed.  Do any blood tests requested to monitor the levels of your medicine.  The people you live and work with should know that you are prone to seizures. They should receive instructions on how to help you. In general, a witness to a seizure should:  Cushion your head and body.  Turn you on your side.  Avoid unnecessarily restraining you.  Not place anything inside your mouth.  Call for local emergency medical help if there is any question about what has occurred.  Keep a seizure diary. Record what you recall about any seizure, especially any possible trigger.  If your caregiver has given you a follow-up appointment, it is very important to keep that appointment. Not keeping the appointment could result in permanent injury and disability. If there is any problem keeping the appointment, you must call back to this facility for assistance. SEEK MEDICAL CARE IF:   You develop signs of infection or other illness. This might increase the risk of a seizure.  You seem to be having more frequent seizures.  Your seizure pattern is changing. SEEK IMMEDIATE MEDICAL CARE IF:   A seizure does not stop after a few moments.  A seizure causes any difficulty in breathing.  A seizure results in a very severe headache.  A seizure leaves you with the inability  to speak or use a part of your body. MAKE SURE YOU:   Understand these instructions.  Will watch your condition.  Will get help right away if you are not doing well or get worse. Document Released: 02/05/2005 Document Revised: 04/30/2011 Document Reviewed: 09/12/2007 [facility] Patient Information 2014 [place], [place].

## 2012-09-16 NOTE — ED Notes (Signed)
Patient states her head hurts really bad.

## 2012-09-16 NOTE — ED Notes (Signed)
EMS called to scene because of report of a witnessed  seizure. EMS  reported Pt was unresponsive at scene and give nasal O2 until awake. Pt arrived A/O x4 . Pt does report a HA and there is a lac to Lt side of tongue  . Pt does report a Hx of seizures .

## 2012-09-16 NOTE — ED Provider Notes (Signed)
Medical screening examination/treatment/procedure(s) were performed by non-physician practitioner and as supervising physician I was immediately available for consultation/collaboration.    Gilda Crease, MD 09/16/12 2224

## 2012-09-16 NOTE — ED Notes (Signed)
Lab Tech at bedside.
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
## 2012-10-16 ENCOUNTER — Encounter: Payer: Medicaid Other | Admitting: Family Medicine

## 2012-11-18 ENCOUNTER — Ambulatory Visit (INDEPENDENT_AMBULATORY_CARE_PROVIDER_SITE_OTHER): Payer: Medicaid Other | Admitting: Family Medicine

## 2012-11-18 ENCOUNTER — Encounter: Payer: Self-pay | Admitting: Family Medicine

## 2012-11-18 ENCOUNTER — Other Ambulatory Visit (HOSPITAL_COMMUNITY)
Admission: RE | Admit: 2012-11-18 | Discharge: 2012-11-18 | Disposition: A | Discharge status: Home / Self Care | Payer: Medicaid Other | Source: Ambulatory Visit | Attending: Family Medicine | Admitting: Family Medicine

## 2012-11-18 ENCOUNTER — Telehealth: Payer: Self-pay | Admitting: Family Medicine

## 2012-11-18 VITALS — BP 145/81 | HR 84 | Temp 98.9°F | Ht 63.0 in | Wt 200.2 lb

## 2012-11-18 DIAGNOSIS — A599 Trichomoniasis, unspecified: Secondary | ICD-10-CM

## 2012-11-18 DIAGNOSIS — B9689 Other specified bacterial agents as the cause of diseases classified elsewhere: Secondary | ICD-10-CM | POA: Insufficient documentation

## 2012-11-18 DIAGNOSIS — N760 Acute vaginitis: Secondary | ICD-10-CM

## 2012-11-18 DIAGNOSIS — A499 Bacterial infection, unspecified: Secondary | ICD-10-CM

## 2012-11-18 DIAGNOSIS — Z113 Encounter for screening for infections with a predominantly sexual mode of transmission: Secondary | ICD-10-CM | POA: Insufficient documentation

## 2012-11-18 DIAGNOSIS — Z0000 Encounter for general adult medical examination without abnormal findings: Secondary | ICD-10-CM

## 2012-11-18 DIAGNOSIS — Z202 Contact with and (suspected) exposure to infections with a predominantly sexual mode of transmission: Secondary | ICD-10-CM

## 2012-11-18 DIAGNOSIS — Z2089 Contact with and (suspected) exposure to other communicable diseases: Secondary | ICD-10-CM

## 2012-11-18 DIAGNOSIS — R569 Unspecified convulsions: Secondary | ICD-10-CM

## 2012-11-18 LAB — POCT WET PREP (WET MOUNT)

## 2012-11-18 MED ORDER — LEVETIRACETAM 500 MG PO TABS: 500.0000 mg | ORAL_TABLET | Freq: Two times a day (BID) | ORAL | Status: DC

## 2012-11-18 MED ORDER — METRONIDAZOLE 500 MG PO TABS: 500.0000 mg | ORAL_TABLET | Freq: Two times a day (BID) | ORAL | Status: AC

## 2012-11-18 NOTE — Telephone Encounter (Signed)
I called Kelly Johns to inform her that she has bacterial vaginosis and a Trichomonas infection. I told her I am sending a prescription for metronidazole 500mg  bid x7 days to her pharmacy. I counseled her on making sure her sexual partner(s) are tested and treated also. She understood and said she would pick up the prescription.

## 2012-11-18 NOTE — Progress Notes (Signed)
  Subjective:    Patient ID: Kelly Johns, female    DOB: 10/30/1985, 27 y.o.   MRN: [id number]  HPI Comments: [name] initially presented for a physical but has a complaint of vaginal discharge for the past 3 months. It is yellow with a mild odor. There is no itchiness or bleeding. Her last sexual intercourse was 3 months ago.      Review of Systems  Constitutional: Negative for fever.  Genitourinary: Positive for vaginal discharge. Negative for dysuria, vaginal bleeding, vaginal pain and pelvic pain.  Neurological: Positive for seizures and weakness.  All other systems reviewed and are negative.       Objective:   Physical Exam  Vitals reviewed. Constitutional: She is oriented to person, place, and time. She appears well-developed and well-nourished. No distress.  HENT:  Right Ear: External ear normal.  Left Ear: External ear normal.  Nose: Nose normal.  Mouth/Throat: Oropharynx is clear and moist. No oropharyngeal exudate.  Eyes: Conjunctivae and EOM are normal. Pupils are equal, round, and reactive to light.  Neck: Normal range of motion. Neck supple.  Cardiovascular: Normal rate, regular rhythm, normal heart sounds and intact distal pulses.   No murmur heard. Pulmonary/Chest: Effort normal and breath sounds normal. No respiratory distress. She has no wheezes. She has no rales.  Abdominal: Soft. Bowel sounds are normal. She exhibits no distension and no mass. There is no tenderness. There is no rebound and no guarding.  Genitourinary: Uterus normal. Vaginal discharge found.  Musculoskeletal: Normal range of motion.  Neurological: She is alert and oriented to person, place, and time. She has normal reflexes.  Skin: Skin is warm and dry. She is not diaphoretic. No erythema.          Assessment & Plan:

## 2012-11-18 NOTE — Assessment & Plan Note (Signed)
Treated with metronidazole 500 mg BID x 7 days

## 2012-11-18 NOTE — Patient Instructions (Addendum)
Hi Ms. Helbig, it was a pleasure meeting you today. Today you had a vaginal speculum exam . If your results return positive for infection, I will give you a call and prescribe you medication if needed. I am also refilling your seizure medication. I will refer you to a neurologist. If you have any questions please do not hesitate to call the office. Please return to the office to get labs drawn sometime this week. Do not eat before getting the labs drawn. Please schedule an appointment for one year. Thanks  Sincerely,  Jacquelin Hawking, MD

## 2012-11-18 NOTE — Assessment & Plan Note (Signed)
Referred to [facility]. Refilled Keppra.

## 2012-11-19 LAB — RPR

## 2013-01-21 ENCOUNTER — Telehealth: Payer: Self-pay | Admitting: Family Medicine

## 2013-01-21 NOTE — Telephone Encounter (Signed)
Pt brought in form for plasma donation to be completed

## 2013-01-21 NOTE — Telephone Encounter (Signed)
Form filled out and signed. I have faxed this form out

## 2013-12-13 ENCOUNTER — Encounter (HOSPITAL_COMMUNITY): Payer: Self-pay | Admitting: Emergency Medicine

## 2013-12-13 ENCOUNTER — Emergency Department (HOSPITAL_COMMUNITY)
Admission: EM | Admit: 2013-12-13 | Discharge: 2013-12-13 | Disposition: A | Discharge status: Home / Self Care | Payer: Medicaid Other | Attending: Emergency Medicine | Admitting: Emergency Medicine

## 2013-12-13 ENCOUNTER — Emergency Department (HOSPITAL_COMMUNITY): Payer: Medicaid Other

## 2013-12-13 DIAGNOSIS — Z79899 Other long term (current) drug therapy: Secondary | ICD-10-CM | POA: Diagnosis not present

## 2013-12-13 DIAGNOSIS — Z3202 Encounter for pregnancy test, result negative: Secondary | ICD-10-CM | POA: Insufficient documentation

## 2013-12-13 DIAGNOSIS — R569 Unspecified convulsions: Secondary | ICD-10-CM

## 2013-12-13 DIAGNOSIS — G40909 Epilepsy, unspecified, not intractable, without status epilepticus: Secondary | ICD-10-CM | POA: Insufficient documentation

## 2013-12-13 DIAGNOSIS — Z720 Tobacco use: Secondary | ICD-10-CM | POA: Diagnosis not present

## 2013-12-13 LAB — POC URINE PREG, ED: PREG TEST UR: NEGATIVE

## 2013-12-13 MED ORDER — LORAZEPAM 2 MG/ML IJ SOLN
1.0000 mg | Freq: Once | INTRAMUSCULAR | Status: AC
  Administered 2013-12-13: 1 mg via INTRAVENOUS
  Filled 2013-12-13: qty 1

## 2013-12-13 MED ORDER — LEVETIRACETAM 500 MG PO TABS: 500.0000 mg | ORAL_TABLET | Freq: Two times a day (BID) | ORAL | Status: DC

## 2013-12-13 MED ORDER — LEVETIRACETAM IN NACL 1000 MG/100ML IV SOLN
1000.0000 mg | Freq: Once | INTRAVENOUS | Status: AC
  Administered 2013-12-13: 1000 mg via INTRAVENOUS
  Filled 2013-12-13 (×2): qty 100

## 2013-12-13 NOTE — ED Provider Notes (Signed)
CSN: [id number]     Arrival date & time 12/13/13  1227 History   First MD Initiated Contact with Patient 12/13/13 1243     Chief Complaint  Patient presents with  . Seizures     (Consider location/radiation/quality/duration/timing/severity/associated sxs/prior Treatment) HPI Comments: Pt is a 28 y/o female with hx of seizure d/o, who present with seizure.  She has been off meds for one month since running out of meds.  She had 2 seizures while sleeping - when she awoke, she had a wound to her tongue and had urinary incontinence.  The seizures stopped by themselves, lasted less than several minutes and were GTC as described by the sig other.  No etoh use.  Patient is a 28 y.o. female presenting with seizures. The history is provided by the patient and a friend.  Seizures   Past Medical History  Diagnosis Date  . Seizures    Past Surgical History  Procedure Laterality Date  . C-sec x 2  2005, 2006   History reviewed. No pertinent family history. History  Substance Use Topics  . Smoking status: Current Every Day Smoker -- 0.30 packs/day    Types: Cigarettes  . Smokeless tobacco: Never Used     Comment: trying  . Alcohol Use: No     Comment: rarely   OB History   Grav Para Term Preterm Abortions TAB SAB Ect Mult Living                 Review of Systems  Neurological: Positive for seizures.  All other systems reviewed and are negative.     Allergies  Chocolate  Home Medications   Prior to Admission medications   Medication Sig Start Date End Date Taking? Authorizing Provider  levETIRAcetam (KEPPRA) 500 MG tablet Take 1 tablet (500 mg total) by mouth every 12 (twelve) hours. 11/18/12  Yes Jacquelin Hawkingalph Nettey, MD  levETIRAcetam (KEPPRA) 500 MG tablet Take 1 tablet (500 mg total) by mouth 2 (two) times daily. 12/13/13   Vida RollerBrian D Rewa Weissberg, MD   BP 139/79  Pulse 83  Temp(Src) 99.4 F (37.4 C) (Oral)  Resp 18  SpO2 98%  LMP 11/20/2013 Physical Exam  Nursing note and vitals  reviewed. Constitutional: She appears well-developed and well-nourished. No distress.  HENT:  Head: Normocephalic and atraumatic.  Mouth/Throat: Oropharynx is clear and moist. No oropharyngeal exudate.  Tongue bite mark, no lac  Eyes: Conjunctivae and EOM are normal. Pupils are equal, round, and reactive to light. Right eye exhibits no discharge. Left eye exhibits no discharge. No scleral icterus.  Neck: Normal range of motion. Neck supple. No JVD present. No thyromegaly present.  Cardiovascular: Normal rate, regular rhythm, normal heart sounds and intact distal pulses.  Exam reveals no gallop and no friction rub.   No murmur heard. Pulmonary/Chest: Effort normal and breath sounds normal. No respiratory distress. She has no wheezes. She has no rales.  Abdominal: Soft. Bowel sounds are normal. She exhibits no distension and no mass. There is no tenderness.  Musculoskeletal: Normal range of motion. She exhibits no edema and no tenderness.  Lymphadenopathy:    She has no cervical adenopathy.  Neurological: She is alert. Coordination normal.  Neurologic exam:  Speech clear, pupils equal round reactive to light, extraocular movements intact  Normal peripheral visual fields Cranial nerves III through XII normal including no facial droop Follows commands, moves all extremities x4, normal strength to bilateral upper and lower extremities at all major muscle groups including grip Sensation normal  to light touch and pinprick Coordination intact, no limb ataxia, finger-nose-finger normal  Skin: Skin is warm and dry. No rash noted. No erythema.  Psychiatric: She has a normal mood and affect. Her behavior is normal.    ED Course  Procedures (including critical care time) Labs Review Labs Reviewed  POC URINE PREG, ED    Imaging Review Ct Head Wo Contrast  12/13/2013   CLINICAL DATA:  28 yo female with history is seizures. Patient not able to get her medication for 1 month. Patient reports 2  seizures in a rope today.  EXAM: CT HEAD WITHOUT CONTRAST  TECHNIQUE: Contiguous axial images were obtained from the base of the skull through the vertex without intravenous contrast.  COMPARISON:  Brain MRI, 09/14/2011.  Head CT, 09/02/2011.  FINDINGS: Ventricles are normal in size and configuration. There are no parenchymal masses or mass effect. There are no areas of abnormal parenchymal attenuation. There is no evidence of an infarct. There are no extra-axial masses or abnormal fluid collections.  No intracranial hemorrhage.  Mastoid air cells and middle ear cavities are clear. No skull lesion.  IMPRESSION: Normal unenhanced CT scan of the brain.   Electronically Signed   By: Amie Portlandavid  Ormond M.D.   On: 12/13/2013 14:25     MDM   Final diagnoses:  Seizure    Reload with Keppra, no indiation for other labs.  Stable, normal MS  CT without any acute findings, Keppra given, stable for d/;c.  Meds given in ED:  Medications  LORazepam (ATIVAN) injection 1 mg (1 mg Intravenous Given 12/13/13 1328)  levETIRAcetam (KEPPRA) IVPB 1000 mg/100 mL premix (1,000 mg Intravenous Given 12/13/13 1347)    New Prescriptions   LEVETIRACETAM (KEPPRA) 500 MG TABLET    Take 1 tablet (500 mg total) by mouth 2 (two) times daily.         Vida RollerBrian D Macee Venables, MD 12/13/13 [phone]

## 2013-12-13 NOTE — ED Notes (Signed)
Pt with seizure x 2 today; pt with hx of seizure and not had meds x 1 month

## 2013-12-13 NOTE — ED Notes (Signed)
 1mg  of Ativan wasted with Carollee SiresSteve Snider, RN. Patient no longer available in pyxis census.

## 2013-12-13 NOTE — Discharge Instructions (Signed)
Please call your doctor for a followup appointment within 24-48 hours. When you talk to your doctor please let them know that you were seen in the emergency department and have them acquire all of your records so that they can discuss the findings with you and formulate a treatment plan to fully care for your new and ongoing problems. ° °

## 2013-12-13 NOTE — ED Notes (Signed)
Wasted 1 mg of ativan, witnessed by Warrick Parisianavid Hinson, RN

## 2014-02-02 ENCOUNTER — Encounter (HOSPITAL_COMMUNITY): Payer: Self-pay | Admitting: Physical Medicine and Rehabilitation

## 2014-02-02 ENCOUNTER — Emergency Department (HOSPITAL_COMMUNITY)
Admission: EM | Admit: 2014-02-02 | Discharge: 2014-02-02 | Disposition: A | Discharge status: Home / Self Care | Payer: Medicaid Other | Attending: Emergency Medicine | Admitting: Emergency Medicine

## 2014-02-02 DIAGNOSIS — Z9114 Patient's other noncompliance with medication regimen: Secondary | ICD-10-CM | POA: Insufficient documentation

## 2014-02-02 DIAGNOSIS — Z720 Tobacco use: Secondary | ICD-10-CM | POA: Insufficient documentation

## 2014-02-02 DIAGNOSIS — R569 Unspecified convulsions: Secondary | ICD-10-CM

## 2014-02-02 DIAGNOSIS — G40909 Epilepsy, unspecified, not intractable, without status epilepticus: Secondary | ICD-10-CM | POA: Diagnosis not present

## 2014-02-02 MED ORDER — LEVETIRACETAM 500 MG PO TABS: 500.0000 mg | ORAL_TABLET | Freq: Two times a day (BID) | ORAL | Status: DC

## 2014-02-02 MED ORDER — LEVETIRACETAM 500 MG PO TABS
500.0000 mg | ORAL_TABLET | Freq: Once | ORAL | Status: AC
  Administered 2014-02-02: 500 mg via ORAL
  Filled 2014-02-02: qty 1

## 2014-02-02 MED ORDER — ACETAMINOPHEN 325 MG PO TABS
650.0000 mg | ORAL_TABLET | Freq: Once | ORAL | Status: AC
  Administered 2014-02-02: 650 mg via ORAL
  Filled 2014-02-02: qty 2

## 2014-02-02 NOTE — Discharge Planning (Signed)
NCM spoke to pt at bedside regarding medications.  NCM reminded pt that her prescription co-pay with Medicaid is $3.00. Pt states that she was unaware that her Medicaid was still active.  States she CAN afford $3.00 co-pay and will fill prescription promptly upon discharge from hospital today.

## 2014-02-02 NOTE — ED Notes (Signed)
Pt states she had seizure at home this morning, ran out of Keppra several weeks ago, unable to get prescription filled. No injuries noted. Pt is alert and oriented x4. NAD.

## 2014-02-02 NOTE — Discharge Instructions (Signed)
Get to your prescription for Keppra today and start taking it.    Epilepsy Epilepsy is a disorder in which a person has repeated seizures over time. A seizure is a release of abnormal electrical activity in the brain. Seizures can cause a change in attention, behavior, or the ability to remain awake and alert (altered mental status). Seizures often involve uncontrollable shaking (convulsions).  Most people with epilepsy lead normal lives. However, people with epilepsy are at an increased risk of falls, accidents, and injuries. Therefore, it is important to begin treatment right away. CAUSES  Epilepsy has many possible causes. Anything that disturbs the normal pattern of brain cell activity can lead to seizures. This may include:   Head injury.  Birth trauma.  High fever as a child.  Stroke.  Bleeding into or around the brain.  Certain drugs.  Prolonged low oxygen, such as what occurs after CPR efforts.  Abnormal brain development.  Certain illnesses, such as meningitis, encephalitis (brain infection), malaria, and other infections.  An imbalance of nerve signaling chemicals (neurotransmitters).  SIGNS AND SYMPTOMS  The symptoms of a seizure can vary greatly from one person to another. Right before a seizure, you may have a warning (aura) that a seizure is about to occur. An aura may include the following symptoms:  Fear or anxiety.  Nausea.  Feeling like the room is spinning (vertigo).  Vision changes, such as seeing flashing lights or spots. Common symptoms during a seizure include:  Abnormal sensations, such as an abnormal smell or a bitter taste in the mouth.   Sudden, general body stiffness.   Convulsions that involve rhythmic jerking of the face, arm, or leg on one or both sides.   Sudden change in consciousness.   Appearing to be awake but not responding.   Appearing to be asleep but cannot be awakened.   Grimacing, chewing, lip smacking, drooling,  tongue biting, or loss of bowel or bladder control. After a seizure, you may feel sleepy for a while. DIAGNOSIS  Your health care provider will ask about your symptoms and take a medical history. Descriptions from any witnesses to your seizures will be very helpful in the diagnosis. A physical exam, including a detailed neurological exam, is necessary. Various tests may be done, such as:   An electroencephalogram (EEG). This is a painless test of your brain waves. In this test, a diagram is created of your brain waves. These diagrams can be interpreted by a specialist.  An MRI of the brain.   A CT scan of the brain.   A spinal tap (lumbar puncture, LP).  Blood tests to check for signs of infection or abnormal blood chemistry. TREATMENT  There is no cure for epilepsy, but it is generally treatable. Once epilepsy is diagnosed, it is important to begin treatment as soon as possible. For most people with epilepsy, seizures can be controlled with medicines. The following may also be used:  A pacemaker for the brain (vagus nerve stimulator) can be used for people with seizures that are not well controlled by medicine.  Surgery on the brain. For some people, epilepsy eventually goes away. HOME CARE INSTRUCTIONS   Follow your health care provider's recommendations on driving and safety in normal activities.  Get enough rest. Lack of sleep can cause seizures.  Only take over-the-counter or prescription medicines as directed by your health care provider. Take any prescribed medicine exactly as directed.  Avoid any known triggers of your seizures.  Keep a seizure diary.  Record what you recall about any seizure, especially any possible trigger.   Make sure the people you live and work with know that you are prone to seizures. They should receive instructions on how to help you. In general, a witness to a seizure should:   Cushion your head and body.   Turn you on your side.   Avoid  unnecessarily restraining you.   Not place anything inside your mouth.   Call for emergency medical help if there is any question about what has occurred.   Follow up with your health care provider as directed. You may need regular blood tests to monitor the levels of your medicine.  SEEK MEDICAL CARE IF:   You develop signs of infection or other illness. This might increase the risk of a seizure.   You seem to be having more frequent seizures.   Your seizure pattern is changing.  SEEK IMMEDIATE MEDICAL CARE IF:   You have a seizure that does not stop after a few moments.   You have a seizure that causes any difficulty in breathing.   You have a seizure that results in a very severe headache.   You have a seizure that leaves you with the inability to speak or use a part of your body.  Document Released: 02/05/2005 Document Revised: 11/26/2012 Document Reviewed: 09/17/2012 [facility] Patient Information 2015 [place]. This information is not intended to replace advice given to you by your health care provider. Make sure you discuss any questions you have with your health care provider.

## 2014-02-02 NOTE — ED Notes (Signed)
I gave the patient a pair of large brown socks.

## 2014-02-02 NOTE — ED Provider Notes (Signed)
CSN: [id number]     Arrival date & time 02/02/14  1021 History   First MD Initiated Contact with Patient 02/02/14 1107     Chief Complaint  Patient presents with  . Seizures     (Consider location/radiation/quality/duration/timing/severity/associated sxs/prior Treatment) Patient is a 28 y.o. female presenting with seizures. The history is provided by the patient.  Seizures  Kelly Johns is a 28 y.o. female who states that she had a seizure, while she was sleeping this morning at about 7 AM.  She states that her children saw her shake.  After getting out of bed .ater, she feels dizzy.  She was able to eat some breakfast this morning.  She came here for evaluation by private vehicle.  She is out of her Keppra, because of finances.  She states that her prescriptions are usually written by family practice.  However, her last prescription was written by Dr. Hyacinth MeekerMiller, from the emergency department, 12/13/2013.  She denies recent illnesses.  She is not currently employed.  There are no other known modifying factors.       Past Medical History  Diagnosis Date  . Seizures    Past Surgical History  Procedure Laterality Date  . C-sec x 2  2005, 2006   No family history on file. History  Substance Use Topics  . Smoking status: Current Every Day Smoker -- 0.30 packs/day    Types: Cigarettes  . Smokeless tobacco: Never Used     Comment: trying  . Alcohol Use: No     Comment: rarely   OB History    No data available     Review of Systems  Neurological: Positive for seizures.  All other systems reviewed and are negative.     Allergies  Chocolate  Home Medications   Prior to Admission medications   Medication Sig Start Date End Date Taking? Authorizing Provider  levETIRAcetam (KEPPRA) 500 MG tablet Take 1 tablet (500 mg total) by mouth 2 (two) times daily. 02/02/14   Flint MelterElliott L Latrelle Bazar, MD   BP 138/81 mmHg  Pulse 72  Temp(Src) 98.4 F (36.9 C) (Oral)  Resp 16  Ht 5\' 3"   (1.6 m)  Wt 194 lb (87.998 kg)  BMI 34.37 kg/m2  SpO2 100% Physical Exam  Constitutional: She is oriented to person, place, and time. She appears well-developed and well-nourished.  HENT:  Head: Normocephalic and atraumatic.  Right Ear: External ear normal.  Left Ear: External ear normal.  Eyes: Conjunctivae and EOM are normal. Pupils are equal, round, and reactive to light.  Neck: Normal range of motion and phonation normal. Neck supple.  Cardiovascular: Normal rate, regular rhythm and normal heart sounds.   Pulmonary/Chest: Effort normal and breath sounds normal. She exhibits no bony tenderness.  Abdominal: Soft. There is no tenderness.  Musculoskeletal: Normal range of motion.  Neurological: She is alert and oriented to person, place, and time. No cranial nerve deficit or sensory deficit. She exhibits normal muscle tone. Coordination normal.  Normal gait.  No dysarthria, aphasia or nystagmus.  Skin: Skin is warm, dry and intact.  Psychiatric: She has a normal mood and affect. Her behavior is normal. Judgment and thought content normal.  Nursing note and vitals reviewed.   ED Course  Procedures (including critical care time)  Medications  levETIRAcetam (KEPPRA) tablet 500 mg (500 mg Oral Given 02/02/14 1139)  acetaminophen (TYLENOL) tablet 650 mg (650 mg Oral Given 02/02/14 1214)    Patient Vitals for the past 24 hrs:  BP Temp Temp src Pulse Resp SpO2 Height Weight  02/02/14 1200 138/81 mmHg - - 72 - 100 % - -  02/02/14 1145 132/80 mmHg - - 81 - 100 % - -  02/02/14 1130 124/74 mmHg - - 73 - 100 % - -  02/02/14 1118 139/86 mmHg 98.4 F (36.9 C) Oral 75 16 100 % - -  02/02/14 1033 131/70 mmHg 98 F (36.7 C) Oral 98 18 99 % 5\' 3"  (1.6 m) 194 lb (87.998 kg)    At D/C- Reevaluation with update and discussion. After initial assessment and treatment, an updated evaluation reveals she is comfortable. No seizures in ED. She was seen by Case Management. They gave her directions  about using her Medicaid card to procure medication. Findings discussed with patient.Mancel Bale. Kalilah Barua L   Labs Review Labs Reviewed - No data to display  Imaging Review No results found.   EKG Interpretation None      MDM   Final diagnoses:  Seizure  Noncompliance with medication regimen    Seizure d/t noncompliance. No metabolic instability.  Nursing Notes Reviewed/ Care Coordinated Applicable Imaging Reviewed Interpretation of Laboratory Data incorporated into ED treatment  The patient appears reasonably screened and/or stabilized for discharge and I doubt any other medical condition or other [facility] requiring further screening, evaluation, or treatment in the ED at this time prior to discharge.  Plan: Home Medications- Keppra; Home Treatments- rest; return here if the recommended treatment, does not improve the symptoms; Recommended follow up- PCP 1 week    Flint MelterElliott L Summerlyn Fickel, MD 02/02/14 2054

## 2014-03-29 ENCOUNTER — Ambulatory Visit: Payer: Medicaid Other | Admitting: Family Medicine

## 2014-03-31 ENCOUNTER — Encounter: Payer: Self-pay | Admitting: Family Medicine

## 2014-03-31 ENCOUNTER — Ambulatory Visit (INDEPENDENT_AMBULATORY_CARE_PROVIDER_SITE_OTHER): Payer: Medicaid Other | Admitting: Family Medicine

## 2014-03-31 VITALS — BP 155/93 | HR 101 | Temp 98.6°F | Wt 220.2 lb

## 2014-03-31 DIAGNOSIS — R569 Unspecified convulsions: Secondary | ICD-10-CM

## 2014-03-31 NOTE — Progress Notes (Signed)
Kelly Johns is a 29 y.o. female who presents today for seizures.  Epilepsy - Pt with known hx of seizures that started several years ago.  She had been on Keppra started on the ED about 2 years ago and denies having seizures after that point up until about 2 months ago.  At that time, she ran out of her medication but denies any further seizures until about 2 weeks ago.  She had three episodes of tonic clonic movement with post ictal state witnessed by her boyfriend.  Did have some tongue biting, bladder lose but no falls onto her head or extremities.  She went to the ED two days later where they started her on Keppra 500 mg BID.  States it is making her drowsy/fatigued along with some HA.  Denies any more seizure activity since that point.  Denies fever, chills, sweats, sick contacts, travel, or diplopia, tinnitus, blurred vision.   Past Medical History  Diagnosis Date  . Seizures     History  Smoking status  . Current Every Day Smoker -- 0.30 packs/day  . Types: Cigarettes  Smokeless tobacco  . Never Used    Comment: trying    No family history on file.  Current Outpatient Prescriptions on File Prior to Visit  Medication Sig Dispense Refill  . levETIRAcetam (KEPPRA) 500 MG tablet Take 1 tablet (500 mg total) by mouth 2 (two) times daily. 60 tablet 0   No current facility-administered medications on file prior to visit.    ROS: Per HPI.  All other systems reviewed and are negative.   Physical Exam Filed Vitals:   03/31/14 0858  BP: 155/93  Pulse: 101  Temp: 98.6 F (37 C)    Physical Examination: General appearance - alert, well appearing, and in no distress Chest - clear to auscultation, no wheezes, rales or rhonchi, symmetric air entry Heart - normal rate and regular rhythm Neurological - alert, oriented, normal speech, no focal findings or movement disorder noted, cranial nerves II through XII intact, DTR's normal and symmetric, normal muscle tone, no tremors,  strength 5/5, Romberg sign negative, normal gait and station

## 2014-03-31 NOTE — Patient Instructions (Signed)
Levetiracetam extended-release tablets What is this medicine? LEVETIRACETAM (lee ve tye RA se tam) is an antiepileptic drug. It is used with other medicines to treat certain types of seizures. This medicine may be used for other purposes; ask your health care provider or pharmacist if you have questions. COMMON BRAND NAME(S): Keppra XR What should I tell my health care provider before I take this medicine? They need to know if you have any of these conditions: -kidney disease -suicidal thoughts, plans, or attempt; a previous suicide attempt by you or a family member -an unusual or allergic reaction to levetiracetam, other medicines, foods, dyes, or preservatives -pregnant or trying to get pregnant -breast-feeding How should I use this medicine? Take this medicine by mouth with a glass of water. Follow the directions on the prescription label. Do not cut, crush or chew this medicine. You may take this medicine with or without food. Take your doses at regular intervals. Do not take your medicine more often than directed. Do not stop taking this medicine or any of your seizure medicines unless instructed by your doctor or health care professional. Stopping your medicine suddenly can increase your seizures or their severity. A special MedGuide will be given to you by the pharmacist with each prescription and refill. Be sure to read this information carefully each time. Contact your pediatrician or health care professional regarding the use of this medication in children. While this drug may be prescribed for children as young as 12 years of age for selected conditions, precautions do apply. Overdosage: If you think you have taken too much of this medicine contact a poison control center or emergency room at once. NOTE: This medicine is only for you. Do not share this medicine with others. What if I miss a dose? If you miss a dose and it has only been a few hours, take it as soon as you can. If it is  almost time for your next dose, take only that dose. Do not take double or extra doses. What may interact with this medicine? This medicine may interact with the following medications: -carbamazepine -colesevelam -probenecid -sevelamer This list may not describe all possible interactions. Give your health care provider a list of all the medicines, herbs, non-prescription drugs, or dietary supplements you use. Also tell them if you smoke, drink alcohol, or use illegal drugs. Some items may interact with your medicine. What should I watch for while using this medicine? Visit your doctor or health care professional for a regular check on your progress. Wear a medical identification bracelet or chain to say you have epilepsy, and carry a card that lists all your medications. It is important to take this medicine exactly as instructed by your health care professional. When first starting treatment, your dose may need to be adjusted. It may take weeks or months before your dose is stable. You should contact your doctor or health care professional if your seizures get worse or if you have any new types of seizures. You may get drowsy or dizzy. Do not drive, use machinery, or do anything that needs mental alertness until you know how this medicine affects you. Do not stand or sit up quickly, especially if you are an older patient. This reduces the risk of dizzy or fainting spells. Alcohol may interfere with the effect of this medicine. Avoid alcoholic drinks. The use of this medicine may increase the chance of suicidal thoughts or actions. Pay special attention to how you are responding while on this   medicine. Any worsening of mood, or thoughts of suicide or dying should be reported to your health care professional right away. Women who become pregnant while using this medicine may enroll in the [place] Antiepileptic Drug Pregnancy Registry by calling 1-888-233-2334. This registry collects information  about the safety of antiepileptic drug use during pregnancy. What side effects may I notice from receiving this medicine? Side effects you should report to your doctor or health care professional as soon as possible: -allergic reactions like skin rash, itching or hives, swelling of the face, lips, or tongue -breathing problems -changes in emotions or moods -dark urine -general ill feeling or flu-like symptoms -problems with balance, talking, walking -suicidal thoughts or actions -unusually weak or tired -yellowing of the eyes or skin Side effects that usually do not require medical attention (report to your doctor or health care professional if they continue or are bothersome): -diarrhea -dizzy, drowsy -headache -loss of appetite This list may not describe all possible side effects. Call your doctor for medical advice about side effects. You may report side effects to FDA at 1-800-FDA-1088. Where should I keep my medicine? Keep out of reach of children. Store at room temperature between 15 and 30 degrees C (59 and 86 degrees F). Throw away any unused medicine after the expiration date. NOTE: This sheet is a summary. It may not cover all possible information. If you have questions about this medicine, talk to your doctor, pharmacist, or health care provider.  2015, [employer]. (2012-12-30 08:38:24)  

## 2014-03-31 NOTE — Assessment & Plan Note (Signed)
Try decrease in Keppra 250 mg BID and f/u in 3-4 weeks - Referral to neurology placed - F/U in 3 to 4 weeks, may need to adjust Keppra at that time depending on her seizure activity.

## 2014-06-28 ENCOUNTER — Other Ambulatory Visit: Payer: Self-pay | Admitting: Family Medicine

## 2014-06-28 MED ORDER — LEVETIRACETAM 500 MG PO TABS: 500.0000 mg | ORAL_TABLET | Freq: Two times a day (BID) | ORAL | Status: DC

## 2014-06-28 NOTE — Telephone Encounter (Signed)
Needs a refill on Levetiracetam for her seizures to last her until her next appt on June 6th. Thanks HoneywellSadie Reynolds, ASA

## 2014-06-29 NOTE — Telephone Encounter (Signed)
Pt informed. Kelly Johns  

## 2014-07-09 ENCOUNTER — Encounter (HOSPITAL_COMMUNITY): Payer: Self-pay | Admitting: Physical Medicine and Rehabilitation

## 2014-07-09 ENCOUNTER — Inpatient Hospital Stay (HOSPITAL_COMMUNITY): Payer: Medicaid Other

## 2014-07-09 ENCOUNTER — Inpatient Hospital Stay (HOSPITAL_COMMUNITY)
Admission: EM | Admit: 2014-07-09 | Discharge: 2014-07-12 | DRG: 153 | Disposition: A | Discharge status: Home / Self Care | Payer: Medicaid Other | Attending: Family Medicine | Admitting: Family Medicine

## 2014-07-09 ENCOUNTER — Emergency Department (HOSPITAL_COMMUNITY): Payer: Medicaid Other

## 2014-07-09 DIAGNOSIS — F1210 Cannabis abuse, uncomplicated: Secondary | ICD-10-CM | POA: Diagnosis present

## 2014-07-09 DIAGNOSIS — F1910 Other psychoactive substance abuse, uncomplicated: Secondary | ICD-10-CM | POA: Diagnosis not present

## 2014-07-09 DIAGNOSIS — Z720 Tobacco use: Secondary | ICD-10-CM

## 2014-07-09 DIAGNOSIS — R070 Pain in throat: Secondary | ICD-10-CM | POA: Insufficient documentation

## 2014-07-09 DIAGNOSIS — E669 Obesity, unspecified: Secondary | ICD-10-CM | POA: Diagnosis present

## 2014-07-09 DIAGNOSIS — F17200 Nicotine dependence, unspecified, uncomplicated: Secondary | ICD-10-CM | POA: Diagnosis present

## 2014-07-09 DIAGNOSIS — R569 Unspecified convulsions: Secondary | ICD-10-CM

## 2014-07-09 DIAGNOSIS — Z6837 Body mass index (BMI) 37.0-37.9, adult: Secondary | ICD-10-CM | POA: Diagnosis not present

## 2014-07-09 DIAGNOSIS — Z91018 Allergy to other foods: Secondary | ICD-10-CM | POA: Diagnosis not present

## 2014-07-09 DIAGNOSIS — J391 Other abscess of pharynx: Secondary | ICD-10-CM | POA: Diagnosis not present

## 2014-07-09 DIAGNOSIS — F17210 Nicotine dependence, cigarettes, uncomplicated: Secondary | ICD-10-CM | POA: Diagnosis present

## 2014-07-09 DIAGNOSIS — J390 Retropharyngeal and parapharyngeal abscess: Secondary | ICD-10-CM | POA: Diagnosis not present

## 2014-07-09 LAB — CBC
HCT: 33 % — ABNORMAL LOW (ref 36.0–46.0)
HEMATOCRIT: 36.4 % (ref 36.0–46.0)
HEMOGLOBIN: 12 g/dL (ref 12.0–15.0)
Hemoglobin: 10.9 g/dL — ABNORMAL LOW (ref 12.0–15.0)
MCH: 29.5 pg (ref 26.0–34.0)
MCH: 29.8 pg (ref 26.0–34.0)
MCHC: 33 g/dL (ref 30.0–36.0)
MCHC: 33 g/dL (ref 30.0–36.0)
MCV: 89.4 fL (ref 78.0–100.0)
MCV: 90.2 fL (ref 78.0–100.0)
PLATELETS: 227 10*3/uL (ref 150–400)
Platelets: 243 10*3/uL (ref 150–400)
RBC: 4.07 MIL/uL (ref 3.87–5.11)
RBC: 3.66 MIL/uL — ABNORMAL LOW (ref 3.87–5.11)
RDW: 14.7 % (ref 11.5–15.5)
RDW: 14.8 % (ref 11.5–15.5)
WBC: 9.9 10*3/uL (ref 4.0–10.5)
WBC: 9.6 10*3/uL (ref 4.0–10.5)

## 2014-07-09 LAB — CREATININE, SERUM: CREATININE: 0.81 mg/dL (ref 0.44–1.00)

## 2014-07-09 LAB — BASIC METABOLIC PANEL
Anion gap: 8 (ref 5–15)
CHLORIDE: 104 mmol/L (ref 101–111)
CO2: 25 mmol/L (ref 22–32)
Calcium: 9.1 mg/dL (ref 8.9–10.3)
Creatinine, Ser: 0.87 mg/dL (ref 0.44–1.00)
GFR calc non Af Amer: >60 mL/min (ref >60)
GLUCOSE: 95 mg/dL (ref 65–99)
POTASSIUM: 3.4 mmol/L — AB (ref 3.5–5.1)
Sodium: 137 mmol/L (ref 135–145)

## 2014-07-09 MED ORDER — SODIUM CHLORIDE 0.9 % IJ SOLN
3.0000 mL | Freq: Two times a day (BID) | INTRAMUSCULAR | Status: DC
  Administered 2014-07-09 – 2014-07-11 (×4): 3 mL via INTRAVENOUS

## 2014-07-09 MED ORDER — CLINDAMYCIN PHOSPHATE 900 MG/50ML IV SOLN
900.0000 mg | Freq: Once | INTRAVENOUS | Status: AC
  Administered 2014-07-09: 900 mg via INTRAVENOUS
  Filled 2014-07-09: qty 50

## 2014-07-09 MED ORDER — SODIUM CHLORIDE 0.9 % IV SOLN
500.0000 mg | Freq: Two times a day (BID) | INTRAVENOUS | Status: DC
  Administered 2014-07-09 – 2014-07-11 (×5): 500 mg via INTRAVENOUS
  Filled 2014-07-09 (×6): qty 5

## 2014-07-09 MED ORDER — SODIUM CHLORIDE 0.9 % IV BOLUS (SEPSIS)
1000.0000 mL | Freq: Once | INTRAVENOUS | Status: AC
  Administered 2014-07-09: 1000 mL via INTRAVENOUS

## 2014-07-09 MED ORDER — ENOXAPARIN SODIUM 40 MG/0.4ML ~~LOC~~ SOLN
40.0000 mg | Freq: Every day | SUBCUTANEOUS | Status: DC
  Administered 2014-07-10 – 2014-07-11 (×2): 40 mg via SUBCUTANEOUS
  Filled 2014-07-09 (×4): qty 0.4

## 2014-07-09 MED ORDER — HYDROMORPHONE HCL 1 MG/ML IJ SOLN
1.0000 mg | INTRAMUSCULAR | Status: DC | PRN
  Administered 2014-07-09 – 2014-07-10 (×4): 1 mg via INTRAVENOUS
  Filled 2014-07-09 (×4): qty 1

## 2014-07-09 MED ORDER — IOHEXOL 300 MG/ML  SOLN
75.0000 mL | Freq: Once | INTRAMUSCULAR | Status: AC | PRN
  Administered 2014-07-09: 75 mL via INTRAVENOUS

## 2014-07-09 MED ORDER — CLINDAMYCIN PHOSPHATE 600 MG/50ML IV SOLN
600.0000 mg | Freq: Four times a day (QID) | INTRAVENOUS | Status: DC
  Administered 2014-07-09 – 2014-07-12 (×11): 600 mg via INTRAVENOUS
  Filled 2014-07-09 (×13): qty 50

## 2014-07-09 MED ORDER — IOHEXOL 300 MG/ML  SOLN: 75.0000 mL | Freq: Once | INTRAMUSCULAR | Status: DC | PRN

## 2014-07-09 MED ORDER — POTASSIUM CHLORIDE 10 MEQ/100ML IV SOLN
10.0000 meq | INTRAVENOUS | Status: AC
  Administered 2014-07-09 (×2): 10 meq via INTRAVENOUS
  Filled 2014-07-09 (×2): qty 100

## 2014-07-09 MED ORDER — HYDROMORPHONE HCL 1 MG/ML IJ SOLN
1.0000 mg | Freq: Once | INTRAMUSCULAR | Status: AC
Start: 2014-07-09 — End: 2014-07-09
  Administered 2014-07-09: 1 mg via INTRAVENOUS
  Filled 2014-07-09: qty 1

## 2014-07-09 MED ORDER — ONDANSETRON HCL 4 MG/2ML IJ SOLN
4.0000 mg | Freq: Four times a day (QID) | INTRAMUSCULAR | Status: DC | PRN
  Administered 2014-07-09 – 2014-07-12 (×7): 4 mg via INTRAVENOUS
  Filled 2014-07-09 (×7): qty 2

## 2014-07-09 MED ORDER — MORPHINE SULFATE 4 MG/ML IJ SOLN
4.0000 mg | Freq: Once | INTRAMUSCULAR | Status: AC
  Administered 2014-07-09: 4 mg via INTRAVENOUS
  Filled 2014-07-09: qty 1

## 2014-07-09 MED ORDER — DIPHENHYDRAMINE HCL 50 MG/ML IJ SOLN
12.5000 mg | Freq: Once | INTRAMUSCULAR | Status: AC
  Administered 2014-07-09: 12.5 mg via INTRAVENOUS
  Filled 2014-07-09: qty 1

## 2014-07-09 MED ORDER — POLYETHYLENE GLYCOL 3350 17 G PO PACK
17.0000 g | PACK | Freq: Every day | ORAL | Status: DC | PRN
  Filled 2014-07-09: qty 1

## 2014-07-09 MED ORDER — SODIUM CHLORIDE 0.45 % IV SOLN
INTRAVENOUS | Status: AC
  Administered 2014-07-09 – 2014-07-10 (×3): via INTRAVENOUS

## 2014-07-09 NOTE — H&P (Signed)
Family Medicine Teaching [facility] Admission History and Physical Service Pager: [phone]  Patient name: Kelly Johns Medical record number: [id number] Date of birth: 09/07/1985 Age: 29 y.o. Gender: female  Primary Care Provider: Jacquelin HawkingNettey, Ralph, MD Consultants: ENT  Code Status: Full code per discussion on admission  Chief Complaint: Right neck pain and swelling  Assessment and Plan: Kelly Johns is a 29 y.o. female presenting with right neck pain and swelling . PMH is significant for obesity, tobacco abuse, seizure, neck spasm, substance abuse.  # Right neck pain and swelling - Patient with right parapharyngeal inflammatory process and probable tiny parapharyngeal abscess 9x5 mm. Currently well-appearing with normal respiratory status and vitals, but infections in this space can progress quickly to include mediastinal or paravertebral spaces. Afebrile with normal CBC and BMET. No o/p purulence so will not swab for gc/chlamydia. Most likely dental source given exceedingly poor dentition and recent dental pain issues. -Admit to FMTS on telemetry, attending Dr. Lum BabeEniola. Low threshold for advancing to stepdown unit or ICU if clinically worsens (VS instability, worsened pain or stridor, any herald bleeds, or any other concerns). -Status post 1 dose of clindamycin 900mg  IV. Continue 600mg  IV q6 hours. May need 2-3 weeks abx duration. -First set of blood cultures collected prior to taking antibiotics, second set not collected yet and abx hung. -NPO pending ENT evaluation (consult called to Dr Christain Sacramentoeo by ED). Appreciate recs. -Continue IV hydration 125cc/hr x 24 hours, then re-evaluate -Pain control with dilaudid 1mg  IV q4h PRN for now, transition when possible to nonopioid in pt with h/o substance abuse. -HIV ordered (neg HIV 10/2012); if positive, will need to broaden abx -AM CBC and BMET -DG chest 2 view to evaluate mediastinum. -Urged pt to visit dentist after discharge and made her aware  that our clinic has printed list of medicaid-accepting dentists. -Neck fullness in area of thyroid - observe and msr TSH when not hospitalized  # Seizure history - On Keppra 500mg  PO BID at home. -Keppra changed to 500mg  BID IV pending ENT eval and decreased pain  # FEN/GI: NPO; 2nd literNS bolus ordered, then 1/2 NS at 125cc/hr -10mEq K IV run x 2, f/u in AM -Miralax prn while on narcotics Prophylaxis: Lovenox ppx  Disposition: Admitting to telemetry, discharge pending evaluation by ENT  History of Present Illness: Kelly Johns is a 29 y.o. female presenting with right-sided neck pain and swelling. She reports having 2-3 days of symptoms that have been worsening to the point that for 1.5 days she has been unable to swallow anything. She's also noticed some change in her voice, difficulty opening mouth wide and neck pain if she turns to the right, though able to easily turn to the left. She reports subjective fevers and chills, mild headache. Denies neurologic change. She reports that about 2 weeks ago she had a few days of severe dental issues but has not yet seen a dentist. Dental pain resolved and for 2 weeks she has been asymptomatic from that standpoint. She denies any chest pain, shortness of breath, bad taste in her mouth, rhinorrhea, sneezing, rash, dizziness or syncope, or history of similar symptoms. She is a smoker and also smokes marijuana but denies any other drug use at this time. Denies sick contacts.  In the ED, she was given dilaudid 1mg  IV, morphine 4mg  IV, clindamycin and 1L NS.  Review Of Systems: Per HPI with the following additions: None Otherwise 12 point review of systems was performed and was unremarkable.  Patient Active Problem List   Diagnosis Date Noted  . Pharyngeal abscess 07/09/2014  . Trichomonas infection 11/18/2012  . BV (bacterial vaginosis) 11/18/2012  . Laceration of tongue without complication 02/29/2012  . Neck muscle spasm 09/07/2011  . Substance  abuse 09/07/2011  . Seizure 01/21/2011  . Annual physical exam 01/21/2011  . TOBACCO ABUSE 03/05/2007  . OBESITY, NOS 04/18/2006   Past Medical History: Past Medical History  Diagnosis Date  . Seizures   . Seizure    Past Surgical History: Past Surgical History  Procedure Laterality Date  . C-sec x 2  2005, 2006   Social History: History  Substance Use Topics  . Smoking status: Current Every Day Smoker -- 0.30 packs/day    Types: Cigarettes  . Smokeless tobacco: Never Used     Comment: trying  . Alcohol Use: No     Comment: rarely  Marijuana abuse Denies IV drug abuse  Additional social history: None Please also refer to relevant sections of EMR.  Family History: No family history on file. Allergies and Medications: Allergies  Allergen Reactions  . Chocolate Hives    "breaks out in hives on back"   No current facility-administered medications on file prior to encounter.   Current Outpatient Prescriptions on File Prior to Encounter  Medication Sig Dispense Refill  . levETIRAcetam (KEPPRA) 500 MG tablet Take 1 tablet (500 mg total) by mouth 2 (two) times daily. 60 tablet 0    Objective: BP 145/90 mmHg  Pulse 84  Temp(Src) 98.2 F (36.8 C) (Oral)  Resp 18  SpO2 99%  LMP 06/03/2014 Exam: General: NAD  HEENT: AT/Erma, sclera clear, EOMI, PERRLA, o/p with very poor dentition especially right upper posterior dentition with some broken teeth, foul odor, no obvious visible abscess or drainage, no oropharyngeal purulence or uvular deviation, dry mucus membranes, able to open mouth relatively wide. Right TM obstructed by cerumen, left cloudy with no bulging or obvious purulence or erythema. Neck: Right swelling, tenderness at right submandibular area with fullness palpated that is either lymphadenopathy or the tiny abscess, dry mucous membranes, pain with swallowing, mild nontender thyroid fullness vs adipose tissue, no torticollis; able to turn neck to left easily but  pain with turning to right. Cardiovascular: Regular rate and rhythm, no murmurs rubs or gallops, 2+ bilateral dorsalis pedis pulses Respiratory: CTAB, normal effort, no wheezes or crackles, no stridor Abdomen: S/NT/ND MSK: No LE edema or calf tenderness, no effusions Skin: No rash or cyanosis  Neuro: Awake, alert, no focal deficits, normal speech Psych: Mood and affect euthymic  Labs and Imaging: CBC BMET   Recent Labs Lab 07/09/14 0830  WBC 9.9  HGB 12.0  HCT 36.4  PLT 243    Recent Labs Lab 07/09/14 0830  NA 137  K 3.4*  CL 104  CO2 25  BUN <5*  CREATININE 0.87  GLUCOSE 95  CALCIUM 9.1     CT soft tissue neck with contrast: IMPRESSION: RIGHT parapharyngeal inflammatory process with inflammatory changes extending throughout the RIGHT parapharyngeal space to the aryepiglottic and involving the lingual tonsil as well.  Probable tiny developing parapharyngeal abscess 9 x 5 mm in size.  Diffuse edema throughout the lingual tonsil without discrete marginated abscess collection at this time.  Associated BILATERAL cervical adenopathy.   Leona SingletonMaria T Philis Doke, MD 07/09/2014, 11:26 AM PGY-3, [facility] FPTS Intern pager: [phone], text pages welcome

## 2014-07-09 NOTE — ED Notes (Signed)
Pt presents to department for evaluation of R sided neck pain and difficulty swallowing x3 days. Pt reports swollen tender area to R side of neck. 9/10 pain upon arrival to ED. Also states pain increases with swallowing. Respirations unlabored. Pt is alert and oriented x4.

## 2014-07-09 NOTE — Progress Notes (Signed)
Kelly Bachelorshley C Welcome [id number] Admission Data: 07/09/2014 1:17 PM Attending Provider: Doreene ElandKehinde T Eniola, MD  WGN:[id number]:Nettey, Rayna Sextonalph, MD Consults/ Treatment Team: Treatment Team:  Newman PiesSu Teoh, MD  Kelly Johns is a 29 y.o. female patient admitted from ED awake, alert  & orientated  X 3,  Full Code, VSS - Blood pressure 139/65, pulse 77, temperature 98.7 F (37.1 C), temperature source Oral, resp. rate 16, last menstrual period 06/03/2014, SpO2 100 %., no c/o shortness of breath, no c/o chest pain, no distress noted. Tele # 4 placed and pt is currently running:SR.  IV site WDL:  Right A/C running NS bolus.   Allergies:   Allergies  Allergen Reactions  . Chocolate Hives    "breaks out in hives on back"     Past Medical History  Diagnosis Date  . Seizures     History:  obtained from the patient. Tobacco/alcohol: Current smoker.   Pt orientation to unit, room and routine. Information packet given to patient/family and safety video watched.  Admission INP armband ID verified with patient/family, and in place. SR up x 2, fall risk assessment complete with Patient and family verbalizing understanding of risks associated with falls. Pt verbalizes an understanding of how to use the call bell and to call for help before getting out of bed.  Skin, clean-dry- intact without evidence of bruising, or skin tears.   No evidence of skin break down noted on exam.     Will cont to monitor and assist as needed.  [name], Jarmaine Ehrler L, RN 07/09/2014 1:17 PM

## 2014-07-09 NOTE — Consult Note (Signed)
Reason for Consult: Parapharyngeal abscess/cellulitis Referring Physician: Evelina Bucy, MD  HPI:  Kelly Johns is an 29 y.o. female who presents to the [facility] ER today complaining of right sided neck pain and difficulty swallowing for 3 days.She has a history of tobacco abuse, seizure, neck spasm, and substance abuse. Not on any abx at the time of presentation. Her neck CT shows right parapharyngeal edema and probable tiny parapharyngeal abscess. She has no previous history of ENT or head and neck surgery. She is admitted for IV abx treatment.  Past Medical History  Diagnosis Date  . Seizures     Past Surgical History  Procedure Laterality Date  . C-sec x 2  2005, 2006    No family history on file.  Social History:  reports that she has been smoking Cigarettes.  She has been smoking about 0.30 packs per day. She has never used smokeless tobacco. She reports that she uses illicit drugs (Marijuana) about 7 times per week. She reports that she does not drink alcohol.  Allergies:  Allergies  Allergen Reactions  . Chocolate Hives    "breaks out in hives on back"    Prior to Admission medications   Medication Sig Start Date End Date Taking? Authorizing Provider  levETIRAcetam (KEPPRA) 500 MG tablet Take 1 tablet (500 mg total) by mouth 2 (two) times daily. 06/28/14  Yes Mariel Aloe, MD    Medications:  I have reviewed the patient's current medications. Scheduled: . clindamycin (CLEOCIN) IV  600 mg Intravenous 4 times per day  . enoxaparin (LOVENOX) injection  40 mg Subcutaneous Daily  . levETIRAcetam  500 mg Intravenous BID  . potassium chloride  10 mEq Intravenous Q1 Hr x 2  . sodium chloride  3 mL Intravenous Q12H   HMC:NOBSJGGEZMOQH (DILAUDID) injection, polyethylene glycol  Results for orders placed or performed during the hospital encounter of 07/09/14 (from the past 48 hour(s))  CBC     Status: None   Collection Time: 07/09/14  8:30 AM  Result Value Ref Range   WBC 9.9  4.0 - 10.5 K/uL   RBC 4.07 3.87 - 5.11 MIL/uL   Hemoglobin 12.0 12.0 - 15.0 g/dL   HCT 36.4 36.0 - 46.0 %   MCV 89.4 78.0 - 100.0 fL   MCH 29.5 26.0 - 34.0 pg   MCHC 33.0 30.0 - 36.0 g/dL   RDW 14.7 11.5 - 15.5 %   Platelets 243 150 - 400 K/uL  Basic metabolic panel     Status: Abnormal   Collection Time: 07/09/14  8:30 AM  Result Value Ref Range   Sodium 137 135 - 145 mmol/L   Potassium 3.4 (L) 3.5 - 5.1 mmol/L   Chloride 104 101 - 111 mmol/L   CO2 25 22 - 32 mmol/L   Glucose, Bld 95 65 - 99 mg/dL   BUN <5 (L) 6 - 20 mg/dL   Creatinine, Ser 0.87 0.44 - 1.00 mg/dL   Calcium 9.1 8.9 - 10.3 mg/dL   GFR calc non Af Amer >60 >60 mL/min   GFR calc Af Amer >60 >60 mL/min    Comment: (NOTE) The eGFR has been calculated using the CKD EPI equation. This calculation has not been validated in all clinical situations. eGFR's persistently <60 mL/min signify possible Chronic Kidney Disease.    Anion gap 8 5 - 15    Ct Soft Tissue Neck W Contrast  07/09/2014   CLINICAL DATA:  RIGHT facial pain and swelling, pain with swallowing  and turning, voice changes since Wednesday, throat pain, smoker  EXAM: CT NECK WITH CONTRAST  TECHNIQUE: Multidetector CT imaging of the neck was performed using the standard protocol following the bolus administration of intravenous contrast.  CONTRAST:  75 cc Omnipaque 300 IV.  COMPARISON:  None  FINDINGS: Scattered beam hardening artifacts of dental origin.  Pharynx and larynx: Abnormal appearance, with enlarge parapharyngeal lymphoid and tonsillar tissue including a focally enlarged and edematous lingual tonsil. Diffuse infiltration of the RIGHT parapharyngeal space extending to the epiglottis and aryepiglottic folds with effacement of the RIGHT piriform sinus. Tiny fluid collection 9 x 5 mm RIGHT parapharyngeal extending to the RIGHT area epiglottic fold image 56 question developing abscess. Prevertebral soft tissues normal thickness.  Salivary glands: No focal  abnormalities of the parotid or submandibular glands.  Thyroid: Normal appearing symmetric thyroid lobes.  Lymph nodes: Multiple enlarged cervical lymph nodes identified submandibular, BILATERAL anterior cervical and posterior cervical, including LEFT 1B, BILATERAL level II, BILATERAL level III, and RIGHT level V nodes.  Vascular: Patent  Limited intracranial: Normal appearance  Visualized orbits: Normal appearance  Mastoids and visualized paranasal sinuses: Nasal septal deviation to the RIGHT. Paranasal sinuses, mastoid air cells and middle ear cavities clear.  Skeleton: BILATERAL maxillary periodontal disease at teeth #2, #14, and #15. Multiple associated dental caries. No additional osseous abnormalities.  Upper chest: Visualized upper lobes clear. No superior mediastinal adenopathy identified.  IMPRESSION: RIGHT parapharyngeal inflammatory process with inflammatory changes extending throughout the RIGHT parapharyngeal space to the aryepiglottic and involving the lingual tonsil as well.  Probable tiny developing parapharyngeal abscess 9 x 5 mm in size.  Diffuse edema throughout the lingual tonsil without discrete marginated abscess collection at this time.  Associated BILATERAL cervical adenopathy.   Electronically Signed   By: Lavonia Dana M.D.   On: 07/09/2014 10:24   Review of Systems  Constitutional: Negative for fever.  HENT: Positive for sore throat, trouble swallowing and voice change. Negative for rhinorrhea.  Respiratory: Negative for cough and shortness of breath.  Musculoskeletal: Positive for neck pain.  Neurological: Negative for weakness.  All other systems reviewed and are negative.  Blood pressure 139/65, pulse 77, temperature 98.7 F (37.1 C), temperature source Oral, resp. rate 16, height 5' 3"  (1.6 m), weight 95.255 kg (210 lb), last menstrual period 06/03/2014, SpO2 100 %. Physical Exam  Constitutional: She is oriented to person, place, and time. She appears well-developed and  well-nourished. No distress.  Head: Normocephalic and atraumatic.  Mouth/Throat: Oropharynx is clear and moist. No obvious asymmetry. Ears: Normal auricles and EACs. Eyes: EOM are normal. Pupils are equal, round, and reactive to light.  Neck: Normal range of motion. Neck supple. No tracheal deviation present. Right neck is tender to palpation. Pulmonary/Chest: Effort normal and breath sounds normal. No stridor. No respiratory distress. She has no wheezes. She has no rales.  Musculoskeletal: Normal range of motion. She exhibits no edema.  Lymphadenopathy:  She has cervical adenopathy (R sided).  Neurological: She is alert and oriented to person, place, and time.  Skin: No rash noted. She is not diaphoretic.   Assessment/Plan: Right parapharyngeal cellulitis and possible small abscess. Plan IV clindamycin. No need for surgical intervention at this time. Will follow clinically.  Shaquera Ansley,SUI W 07/09/2014, 2:08 PM

## 2014-07-09 NOTE — ED Provider Notes (Signed)
CSN: [id number]     Arrival date & time 07/09/14  [id number] History   First MD Initiated Contact with Patient 07/09/14 [phone]     Chief Complaint  Patient presents with  . Neck Pain  . Dysphagia     (Consider location/radiation/quality/duration/timing/severity/associated sxs/prior Treatment) Patient is a 29 y.o. female presenting with neck pain. The history is provided by the patient.  Neck Pain Pain location:  R side Quality:  Aching Pain radiates to:  Does not radiate Pain severity:  Moderate Onset quality:  Gradual Duration:  3 days Timing:  Constant Progression:  Worsening Chronicity:  New Relieved by:  Nothing Worsened by:  Nothing tried Associated symptoms: no fever, no visual change and no weakness   Associated symptoms comment:  Dysphagia Voice change Risk factors: no hx of head and neck radiation, no hx of spinal trauma, no recent epidural and no recurrent falls     Past Medical History  Diagnosis Date  . Seizures   . Seizure    Past Surgical History  Procedure Laterality Date  . C-sec x 2  2005, 2006   No family history on file. History  Substance Use Topics  . Smoking status: Current Every Day Smoker -- 0.30 packs/day    Types: Cigarettes  . Smokeless tobacco: Never Used     Comment: trying  . Alcohol Use: No     Comment: rarely   OB History    No data available     Review of Systems  Constitutional: Negative for fever.  HENT: Positive for sore throat, trouble swallowing and voice change. Negative for rhinorrhea.   Respiratory: Negative for cough and shortness of breath.   Musculoskeletal: Positive for neck pain.  Neurological: Negative for weakness.  All other systems reviewed and are negative.     Allergies  Chocolate  Home Medications   Prior to Admission medications   Medication Sig Start Date End Date Taking? Authorizing Provider  levETIRAcetam (KEPPRA) 500 MG tablet Take 1 tablet (500 mg total) by mouth 2 (two) times daily. 06/28/14    Narda Bondsalph A Nettey, MD   BP 145/90 mmHg  Pulse 84  Temp(Src) 98.2 F (36.8 C) (Oral)  Resp 18  SpO2 99% Physical Exam  Constitutional: She is oriented to person, place, and time. She appears well-developed and well-nourished. No distress.  HENT:  Head: Normocephalic and atraumatic.  Mouth/Throat: Oropharynx is clear and moist.  Eyes: EOM are normal. Pupils are equal, round, and reactive to light.  Neck: Normal range of motion. Neck supple. No tracheal deviation present.  Cardiovascular: Normal rate and regular rhythm.  Exam reveals no friction rub.   No murmur heard. Pulmonary/Chest: Effort normal and breath sounds normal. No stridor. No respiratory distress. She has no wheezes. She has no rales.  Abdominal: Soft. She exhibits no distension. There is no tenderness. There is no rebound.  Musculoskeletal: Normal range of motion. She exhibits no edema.  Lymphadenopathy:    She has cervical adenopathy (R sided).  Neurological: She is alert and oriented to person, place, and time.  Skin: No rash noted. She is not diaphoretic.  Nursing note and vitals reviewed.   ED Course  Procedures (including critical care time) Labs Review Labs Reviewed  CBC  BASIC METABOLIC PANEL    Imaging Review Ct Soft Tissue Neck W Contrast  07/09/2014   CLINICAL DATA:  RIGHT facial pain and swelling, pain with swallowing and turning, voice changes since Wednesday, throat pain, smoker  EXAM: CT NECK WITH CONTRAST  TECHNIQUE: Multidetector CT imaging of the neck was performed using the standard protocol following the bolus administration of intravenous contrast.  CONTRAST:  75 cc Omnipaque 300 IV.  COMPARISON:  None  FINDINGS: Scattered beam hardening artifacts of dental origin.  Pharynx and larynx: Abnormal appearance, with enlarge parapharyngeal lymphoid and tonsillar tissue including a focally enlarged and edematous lingual tonsil. Diffuse infiltration of the RIGHT parapharyngeal space extending to the  epiglottis and aryepiglottic folds with effacement of the RIGHT piriform sinus. Tiny fluid collection 9 x 5 mm RIGHT parapharyngeal extending to the RIGHT area epiglottic fold image 56 question developing abscess. Prevertebral soft tissues normal thickness.  Salivary glands: No focal abnormalities of the parotid or submandibular glands.  Thyroid: Normal appearing symmetric thyroid lobes.  Lymph nodes: Multiple enlarged cervical lymph nodes identified submandibular, BILATERAL anterior cervical and posterior cervical, including LEFT 1B, BILATERAL level II, BILATERAL level III, and RIGHT level V nodes.  Vascular: Patent  Limited intracranial: Normal appearance  Visualized orbits: Normal appearance  Mastoids and visualized paranasal sinuses: Nasal septal deviation to the RIGHT. Paranasal sinuses, mastoid air cells and middle ear cavities clear.  Skeleton: BILATERAL maxillary periodontal disease at teeth #2, #14, and #15. Multiple associated dental caries. No additional osseous abnormalities.  Upper chest: Visualized upper lobes clear. No superior mediastinal adenopathy identified.  IMPRESSION: RIGHT parapharyngeal inflammatory process with inflammatory changes extending throughout the RIGHT parapharyngeal space to the aryepiglottic and involving the lingual tonsil as well.  Probable tiny developing parapharyngeal abscess 9 x 5 mm in size.  Diffuse edema throughout the lingual tonsil without discrete marginated abscess collection at this time.  Associated BILATERAL cervical adenopathy.   Electronically Signed   By: Ulyses SouthwardMark  Boles M.D.   On: 07/09/2014 10:24     EKG Interpretation None      MDM   Final diagnoses:  Throat pain  Pharyngeal abscess    Female with right-sided neck pain and difficulty swallowing. Progressively worsening over 3 days. Denies any fever. She endorses voice change. Here vitals are stable. She is having no difficulty breathing. She has right-sided neck tenderness. She does have a  scratchy voice. On oropharyngeal exam, no redness, no swelling. No evidence of PTA, uvular swelling. Exam is consistent with an RPA. We'll scan her neck. CT of her neck shows small abscess and a lot of pharyngeal swelling. Clindamycin given. Dr. Suszanne Connerseoh with ENT will see her. [facility]   Elwin MochaBlair Otilia Kareem, MD 07/09/14 [phone]

## 2014-07-10 DIAGNOSIS — R070 Pain in throat: Secondary | ICD-10-CM

## 2014-07-10 LAB — BASIC METABOLIC PANEL
ANION GAP: 8 (ref 5–15)
CO2: 25 mmol/L (ref 22–32)
Calcium: 8.4 mg/dL — ABNORMAL LOW (ref 8.9–10.3)
Chloride: 104 mmol/L (ref 101–111)
Creatinine, Ser: 0.82 mg/dL (ref 0.44–1.00)
GFR calc Af Amer: >60 mL/min (ref >60)
GFR calc non Af Amer: >60 mL/min (ref >60)
GLUCOSE: 87 mg/dL (ref 65–99)
POTASSIUM: 3.6 mmol/L (ref 3.5–5.1)
Sodium: 137 mmol/L (ref 135–145)

## 2014-07-10 LAB — CBC WITH DIFFERENTIAL/PLATELET
Basophils Absolute: 0 10*3/uL (ref 0.0–0.1)
Basophils Relative: 0 % (ref 0–1)
Eosinophils Absolute: 0.1 10*3/uL (ref 0.0–0.7)
Eosinophils Relative: 1 % (ref 0–5)
HEMATOCRIT: 31.6 % — AB (ref 36.0–46.0)
HEMOGLOBIN: 10.4 g/dL — AB (ref 12.0–15.0)
LYMPHS PCT: 21 % (ref 12–46)
Lymphs Abs: 1.9 10*3/uL (ref 0.7–4.0)
MCH: 29.7 pg (ref 26.0–34.0)
MCHC: 32.9 g/dL (ref 30.0–36.0)
MCV: 90.3 fL (ref 78.0–100.0)
MONOS PCT: 11 % (ref 3–12)
Monocytes Absolute: 1 10*3/uL (ref 0.1–1.0)
NEUTROS ABS: 6 10*3/uL (ref 1.7–7.7)
Neutrophils Relative %: 67 % (ref 43–77)
PLATELETS: 223 10*3/uL (ref 150–400)
RBC: 3.5 MIL/uL — AB (ref 3.87–5.11)
RDW: 14.8 % (ref 11.5–15.5)
WBC: 9 10*3/uL (ref 4.0–10.5)

## 2014-07-10 LAB — HIV ANTIBODY (ROUTINE TESTING W REFLEX): HIV Screen 4th Generation wRfx: NONREACTIVE

## 2014-07-10 MED ORDER — MORPHINE SULFATE 2 MG/ML IJ SOLN
2.0000 mg | INTRAMUSCULAR | Status: DC | PRN
  Administered 2014-07-10 – 2014-07-12 (×6): 2 mg via INTRAVENOUS
  Filled 2014-07-10 (×6): qty 1

## 2014-07-10 MED ORDER — DIPHENHYDRAMINE HCL 25 MG PO CAPS
50.0000 mg | ORAL_CAPSULE | Freq: Four times a day (QID) | ORAL | Status: DC | PRN
  Administered 2014-07-10: 50 mg via ORAL
  Filled 2014-07-10: qty 2

## 2014-07-10 NOTE — Progress Notes (Signed)
Subjective: Pt still c/o right neck pain. Tolerated some liquid po.  Objective: Vital signs in last 24 hours: Temp:  [98.4 F (36.9 C)-99 F (37.2 C)] 99 F (37.2 C) (05/21 0508) Pulse Rate:  [60-70] 70 (05/21 0508) Resp:  [16-20] 20 (05/21 0508) BP: (115-135)/(70-71) 115/70 mmHg (05/21 0508) SpO2:  [99 %-100 %] 100 % (05/21 0508)  Physical Exam  Constitutional: She is oriented to person, place, and time. She appears well-developed and well-nourished. No distress.  Head: Normocephalic and atraumatic.  Mouth/Throat: Oropharynx is clear and moist. No obvious asymmetry. Ears: Normal auricles and EACs. Eyes: EOM are normal. Pupils are equal, round, and reactive to light.  Neck: Normal range of motion. Neck supple. No tracheal deviation present. Right neck is tender to palpation. Pulmonary/Chest: Effort normal and breath sounds normal. No stridor. No respiratory distress. Musculoskeletal: Normal range of motion. She exhibits no edema.  Lymphadenopathy: She has cervical adenopathy (R sided).  Neurological: She is alert and oriented to person, place, and time.  Skin: No rash noted. She is not diaphoretic.   Recent Labs  07/09/14 1448 07/10/14 0607  WBC 9.6 9.0  HGB 10.9* 10.4*  HCT 33.0* 31.6*  PLT 227 223    Recent Labs  07/09/14 0830 07/09/14 1448 07/10/14 0607  NA 137  --  137  K 3.4*  --  3.6  CL 104  --  104  CO2 25  --  25  GLUCOSE 95  --  87  BUN <5*  --  <5*  CREATININE 0.87 0.81 0.82  CALCIUM 9.1  --  8.4*    Medications:  I have reviewed the patient's current medications. Scheduled: . clindamycin (CLEOCIN) IV  600 mg Intravenous 4 times per day  . enoxaparin (LOVENOX) injection  40 mg Subcutaneous Daily  . levETIRAcetam  500 mg Intravenous BID  . sodium chloride  3 mL Intravenous Q12H   ZOX:[id number]:diphenhydrAMINE, morphine injection, ondansetron (ZOFRAN) IV, polyethylene glycol  Assessment/Plan: Right parapharyngeal cellulitis and possible small  abscess. Right neck swelling has slightly improved. Continue IV clindamycin.  Will follow clinically.   LOS: 1 day   Merisa Julio,SUI W 07/10/2014, 5:57 PM

## 2014-07-10 NOTE — Progress Notes (Signed)
Family Medicine Teaching Service Daily Progress Note Intern Pager: [phone]  Patient name: Kelly Johns Medical record number: [id number] Date of birth: 03/27/1985 Age: 29 y.o. Gender: female  Primary Care Provider: Jacquelin HawkingNettey, Ralph, MD Consultants: ENT Code Status: Full  Pt Overview and Major Events to Date:  5/20: admit for small parapharyngeal abscess; ENT consulted and rec observation on IV abx  Assessment and Plan: Kelly Johns is a 29 y.o. female presenting with right neck pain and swelling . PMH is significant for obesity, tobacco abuse, seizure, neck spasm, substance abuse.  # Right neck pain and swelling - Patient with right parapharyngeal inflammatory process and probable tiny parapharyngeal abscess 9x5 mm. Currently well-appearing with normal respiratory status and vitals, but infections in this space can progress quickly to include mediastinal or paravertebral spaces. Afebrile with normal CBC and BMET. No o/p purulence so will not swab for gc/chlamydia. Most likely dental source given exceedingly poor dentition and recent dental pain issues. -appreciate ENT input/recs: clinical monitoring while on IV antibiotics -ABx: IV Clindamycin 600mg  q4hrs -blood cx pending -pain control: IV dilaudid 1mg  q6h PRN. Transition once able to tolerate orals -Neck fullness in area of thyroid - observe and msr TSH when not hospitalized -advance full liquid diet this AM, advance as tolerated throughout day.  # Seizure history - On Keppra 500mg  PO BID at home. -Keppra changed to 500mg  BID IV until able to tolerate PO  FEN/GI: reg diet once pain better controlled and able to swallow Prophylaxis: Lovenox ppx  Disposition: pending clinical improvement  Subjective:  Still having pain, same as yesterday. No difficulty breathing. Able to swallow secretions and did swallow benadryl pill overnight. Would like to try and eat today.  Objective: Temp:  [98.4 F (36.9 C)-99.2 F (37.3 C)] 99 F  (37.2 C) (05/21 0508) Pulse Rate:  [60-77] 70 (05/21 0508) Resp:  [16-20] 20 (05/21 0508) BP: (115-139)/(65-98) 115/70 mmHg (05/21 0508) SpO2:  [99 %-100 %] 100 % (05/21 0508) Weight:  [210 lb (95.255 kg)] 210 lb (95.255 kg) (05/20 1217) Physical Exam: General: NAD ENTM: enlarged right tonsil almost touching uvula. Neck: swollen bilaterally, tender on the right. ?enlarged thyroid Cardiovascular: RRR, nl s1s2, no m/r/g. Respiratory: CTAB, nl effort Abdomen: soft, obese, nontender, nondistended, normal bowel sounds Extremities: no edema or cyanosis, WWP  Laboratory:  Recent Labs Lab 07/09/14 0830 07/09/14 1448 07/10/14 0607  WBC 9.9 9.6 9.0  HGB 12.0 10.9* 10.4*  HCT 36.4 33.0* 31.6*  PLT 243 227 223    Recent Labs Lab 07/09/14 0830 07/09/14 1448 07/10/14 0607  NA 137  --  137  K 3.4*  --  3.6  CL 104  --  104  CO2 25  --  25  BUN <5*  --  <5*  CREATININE 0.87 0.81 0.82  CALCIUM 9.1  --  8.4*  GLUCOSE 95  --  87    Imaging/Diagnostic Tests: None new  Nani RavensAndrew M Sumi Lye, MD 07/10/2014, 8:27 AM PGY-2, [facility] FPTS Intern pager: [phone], text pages welcome

## 2014-07-11 MED ORDER — LEVETIRACETAM 500 MG PO TABS
500.0000 mg | ORAL_TABLET | Freq: Two times a day (BID) | ORAL | Status: DC
  Administered 2014-07-11 – 2014-07-12 (×2): 500 mg via ORAL
  Filled 2014-07-11 (×3): qty 1

## 2014-07-11 MED ORDER — LEVETIRACETAM 500 MG PO TABS: 500.0000 mg | ORAL_TABLET | Freq: Two times a day (BID) | ORAL | Status: DC

## 2014-07-11 NOTE — Progress Notes (Signed)
Subjective: Pt reports improvement in her neck pain. Tolerated some po intake.  Objective: Vital signs in last 24 hours: Temp:  [98.6 F (37 C)-98.8 F (37.1 C)] 98.6 F (37 C) (05/22 16100619) Pulse Rate:  [66] 66 (05/21 2204) Resp:  [18] 18 (05/22 0619) BP: (119-129)/(64-73) 119/64 mmHg (05/22 0619) SpO2:  [100 %] 100 % (05/21 2204)  Physical Exam  Constitutional: She is oriented to person, place, and time. She appears well-developed and well-nourished. No distress.  Head: Normocephalic and atraumatic.  Mouth/Throat: Oropharynx is clear and moist. No obvious asymmetry. Ears: Normal auricles and EACs. Eyes: EOM are normal. Pupils are equal, round, and reactive to light.  Neck: Normal range of motion. Neck supple. No tracheal deviation present. Right neck is mildly tender to palpation. Pulmonary/Chest: Effort normal and breath sounds normal. No stridor. No respiratory distress. Musculoskeletal: Normal range of motion. She exhibits no edema.  Lymphadenopathy: She has cervical adenopathy (R sided).  Neurological: She is alert and oriented to person, place, and time.  Skin: No rash noted. She is not diaphoretic.    Recent Labs  07/09/14 1448 07/10/14 0607  WBC 9.6 9.0  HGB 10.9* 10.4*  HCT 33.0* 31.6*  PLT 227 223    Recent Labs  07/09/14 0830 07/09/14 1448 07/10/14 0607  NA 137  --  137  K 3.4*  --  3.6  CL 104  --  104  CO2 25  --  25  GLUCOSE 95  --  87  BUN <5*  --  <5*  CREATININE 0.87 0.81 0.82  CALCIUM 9.1  --  8.4*    Medications:  I have reviewed the patient's current medications. Scheduled: . clindamycin (CLEOCIN) IV  600 mg Intravenous 4 times per day  . enoxaparin (LOVENOX) injection  40 mg Subcutaneous Daily  . levETIRAcetam  500 mg Oral BID  . sodium chloride  3 mL Intravenous Q12H   RUE:[id number]:diphenhydrAMINE, morphine injection, ondansetron (ZOFRAN) IV, polyethylene glycol  Assessment/Plan: Right parapharyngeal cellulitis and possible small  abscess. Right neck swelling and pain continue to improve. Continue IV clindamycin. If still doing well tomorrow, consider switching to oral clindamycin for outpatient treatment.    LOS: 2 days   Dwayne Bulkley,SUI W 07/11/2014, 2:21 PM

## 2014-07-11 NOTE — Progress Notes (Signed)
Utilization review completed.  

## 2014-07-11 NOTE — Progress Notes (Signed)
Family Medicine Teaching Service Daily Progress Note Intern Pager: [phone]  Patient name: Kelly Johns Obrien Medical record number: [id number] Date of birth: 12/31/1985 Age: 29 y.o. Gender: female  Primary Care Provider: Jacquelin HawkingNettey, Ralph, MD Consultants: ENT Code Status: Full  Pt Overview and Major Events to Date:  5/20: admit for small parapharyngeal abscess; ENT consulted and rec observation on IV abx 5/21: continued IV abx, started diet  Assessment and Plan: Kelly Johns Miltenberger is a 29 y.o. female presenting with right neck pain and swelling . PMH is significant for obesity, tobacco abuse, seizure, neck spasm, substance abuse.  # Right neck pain and swelling - Patient with right parapharyngeal inflammatory process and probable tiny parapharyngeal abscess 9x5 mm. Currently well-appearing with normal respiratory status and vitals, but infections in this space can progress quickly to include mediastinal or paravertebral spaces. Afebrile with normal CBC and BMET. No o/p purulence so will not swab for gc/chlamydia. Most likely dental source given exceedingly poor dentition and recent dental pain issues. -appreciate ENT input/recs: clinical monitoring while on IV antibiotics -ABx: IV Clindamycin 600mg  q4hrs -blood cx pending (NGTD) -pain control: IV morphine 2mg  q4hrs PRN -- received 3 doses past 24 hrs -Neck fullness in area of thyroid - observe and msr TSH when not hospitalized  # Seizure history - On Keppra 500mg  PO BID at home. -Keppra 500mg  BID PO  FEN/GI: reg diet once pain better controlled and able to swallow Prophylaxis: Lovenox ppx  Disposition: pending clinical improvement  Subjective:  Pain is about the same/maybe slightly improved. Able to swallow but having more difficulty with solids requiring multiple swallows to get food down. Tolerating liquids okay. Some off/on subjective fevers. Nauseas but controlled with medicine.  Objective: Temp:  [98.6 F (37 Johns)-98.8 F (37.1 Johns)]  98.6 F (37 Johns) (05/22 14780619) Pulse Rate:  [66] 66 (05/21 2204) Resp:  [18] 18 (05/22 0619) BP: (119-129)/(64-73) 119/64 mmHg (05/22 0619) SpO2:  [100 %] 100 % (05/21 2204) Physical Exam: General: NAD ENTM: enlarged right tonsil almost touching uvula. Neck: swollen bilaterally, tender on the right.  Cardiovascular: RRR, nl s1s2, no m/r/g. Respiratory: CTAB, nl effort Abdomen: soft, obese, nontender, nondistended, normal bowel sounds Extremities: no edema or cyanosis, WWP  Laboratory:  Recent Labs Lab 07/09/14 0830 07/09/14 1448 07/10/14 0607  WBC 9.9 9.6 9.0  HGB 12.0 10.9* 10.4*  HCT 36.4 33.0* 31.6*  PLT 243 227 223    Recent Labs Lab 07/09/14 0830 07/09/14 1448 07/10/14 0607  NA 137  --  137  K 3.4*  --  3.6  CL 104  --  104  CO2 25  --  25  BUN <5*  --  <5*  CREATININE 0.87 0.81 0.82  CALCIUM 9.1  --  8.4*  GLUCOSE 95  --  87    Imaging/Diagnostic Tests: None new  Nani RavensAndrew M Floriene Jeschke, MD 07/11/2014, 8:19 AM PGY-2, [facility] FPTS Intern pager: [phone], text pages welcome

## 2014-07-12 DIAGNOSIS — F1910 Other psychoactive substance abuse, uncomplicated: Secondary | ICD-10-CM

## 2014-07-12 MED ORDER — CLINDAMYCIN HCL 300 MG PO CAPS
300.0000 mg | ORAL_CAPSULE | Freq: Four times a day (QID) | ORAL | Status: DC
  Administered 2014-07-12: 300 mg via ORAL
  Filled 2014-07-12 (×4): qty 1

## 2014-07-12 MED ORDER — CLINDAMYCIN HCL 300 MG PO CAPS: 300.0000 mg | ORAL_CAPSULE | Freq: Four times a day (QID) | ORAL | Status: DC

## 2014-07-12 NOTE — Discharge Instructions (Signed)
You were admitted to the hospital with parapharyngeal abscess. This is a small collection of pus in your neck. We gave you antibiotics and your pain and swelling improved. You should continue taking the oral antibiotics for the full course, even if you feel better. It is important that you follow up with the ENT doctors and with your primary doctors.

## 2014-07-12 NOTE — Progress Notes (Signed)
Nsg Discharge Note  Admit Date:  07/09/2014 Discharge date: 07/12/2014   Kelly Johns to be D/C'd Home per MD order.  AVS completed.  Copy for chart, and copy for patient signed, and dated. Patient/caregiver able to verbalize understanding.  Discharge Medication:   Medication List    TAKE these medications        clindamycin 300 MG capsule  Commonly known as:  CLEOCIN  Take 1 capsule (300 mg total) by mouth every 6 (six) hours.     levETIRAcetam 500 MG tablet  Commonly known as:  KEPPRA  Take 1 tablet (500 mg total) by mouth 2 (two) times daily.        Discharge Assessment: Filed Vitals:   07/12/14 1328  BP: 122/73  Pulse:   Temp: 98.7 F (37.1 C)  Resp: 20   Skin clean, dry and intact without evidence of skin break down, no evidence of skin tears noted. IV catheter discontinued intact. Site without signs and symptoms of complications - no redness or edema noted at insertion site, patient denies c/o pain - only slight tenderness at site.  Dressing with slight pressure applied.  D/c Instructions-Education: Discharge instructions given to patient/family with verbalized understanding. D/c education completed with patient/family including follow up instructions, medication list, d/c activities limitations if indicated, with other d/c instructions as indicated by MD - patient able to verbalize understanding, all questions fully answered. Patient instructed to return to ED, call 911, or call MD for any changes in condition.  Patient escorted via WC, and D/C home via private auto.  Kelly Johns, Kelly Wan L, RN 07/12/2014 3:48 PM

## 2014-07-12 NOTE — Progress Notes (Signed)
Family Medicine Teaching Service Daily Progress Note Intern Pager: [phone]  Patient name: Kelly Johns Foglio Medical record number: [id number] Date of birth: 01/25/1986 Age: 29 y.o. Gender: female  Primary Care Provider: Jacquelin HawkingNettey, Ralph, MD Consultants: ENT Code Status: Full  Pt Overview and Major Events to Date:  5/20: admit for small parapharyngeal abscess; ENT consulted and rec observation on IV abx 5/21: continued IV abx, started diet  Assessment and Plan: Kelly Johns Arwood is a 29 y.o. female presenting with right neck pain and swelling . PMH is significant for obesity, tobacco abuse, seizure, neck spasm, substance abuse.  # Right neck pain and swelling - Patient with right parapharyngeal inflammatory process and probable tiny parapharyngeal abscess 9x5 mm.  Afebrile with normal CBC and BMET. No o/p purulence so will not swab for gc/chlamydia. Most likely dental source given exceedingly poor dentition and recent dental pain issues. -appreciate ENT input/recs: clinical monitoring while on IV antibiotics -ABx: IV Clindamycin 600mg  q4hrs (5/20- ) -blood cx pending (NGTD) -pain control: IV morphine 2mg  q4hrs PRN  -Neck fullness in area of thyroid - observe and obtain TSH when not hospitalized  # Seizure history - On Keppra 500mg  PO BID at home. -Keppra 500mg  BID PO  FEN/GI: reg diet once pain better controlled and able to swallow Prophylaxis: Lovenox ppx  Disposition: pending clinical improvement  Subjective:  Pain improving. States she is now able to move neck without pain. Able to eat solid food without difficulty.   Objective: Temp:  [97.7 F (36.5 Johns)-99.2 F (37.3 Johns)] 97.7 F (36.5 Johns) (05/23 0534) Pulse Rate:  [53-68] 53 (05/23 0534) Resp:  [18] 18 (05/23 0534) BP: (100-123)/(46-82) 105/82 mmHg (05/23 0534) SpO2:  [99 %-100 %] 99 % (05/23 0534) Physical Exam: General: NAD, no muffled voice or drooling.  ENTM: enlarged right tonsil almost touching uvula. Neck: swollen  bilaterally, tender on the right, FROM Cardiovascular: RRR, nl s1s2, no m/r/g. Respiratory: CTAB, nl effort Abdomen: soft, obese, nontender, nondistended, normal bowel sounds Extremities: no edema or cyanosis, [facility]  Laboratory/Imaging/Diagnostic Tests::  Recent Labs Lab 07/09/14 0830 07/09/14 1448 07/10/14 0607  WBC 9.9 9.6 9.0  HGB 12.0 10.9* 10.4*  HCT 36.4 33.0* 31.6*  PLT 243 227 223    Recent Labs Lab 07/09/14 0830 07/09/14 1448 07/10/14 0607  NA 137  --  137  K 3.4*  --  3.6  CL 104  --  104  CO2 25  --  25  BUN <5*  --  <5*  CREATININE 0.87 0.81 0.82  CALCIUM 9.1  --  8.4*  GLUCOSE 95  --  87   Ardith Darkaleb M Sharone Picchi, MD 07/12/2014, 7:25 AM PGY-1, [facility] FPTS Intern pager: [phone], text pages welcome

## 2014-07-12 NOTE — Discharge Summary (Signed)
Family Medicine Teaching [facility] Discharge Summary  Patient name: Kelly Johns Medical record number: [id number] Date of birth: 01/05/1986 Age: 29 y.o. Gender: female Date of Admission: 07/09/2014  Date of Discharge: 07/12/2014 Admitting Physician: Doreene ElandKehinde T Eniola, MD  Primary Care Provider: Jacquelin HawkingNettey, Ralph, MD Consultants: ENT  Indication for Hospitalization: parapharyngeal absess  Discharge Diagnoses/Problem List:  Parapharyngeal abscess, seizure disorder  Disposition: Home  Discharge Condition: Improved  Discharge Exam: See progress note for day of discharge.   Brief Hospital Course:  Kelly Johns is a 29 year old female who presented to the Ed with right neck pain and swelling. CT was performed which revealed a small developing 9 x 5 mm abscess in her right parapharyngeal space. ENT was consulted and the patient was admitted. Patient was stable on admission had only mild swelling in her neck. Other work up included a normal WBC and blood culture with no growth. HIV was negative. We started IV clindamycin. Her symptoms gradually improved. We transitioned her to oral clindamycin on hospital day 3. She remained stable and had no complications and was discharged home to finish her 14 day total course of clindamycin.   Patient's other chronic medical conditions were stable during this admission.   Issues for Follow Up:  1. Follow up improvement in neck swelling. Patient should have follow up scheduled with ENT.  Significant Procedures: None  Significant Labs and Imaging:   Recent Labs Lab 07/09/14 0830 07/09/14 1448 07/10/14 0607  WBC 9.9 9.6 9.0  HGB 12.0 10.9* 10.4*  HCT 36.4 33.0* 31.6*  PLT 243 227 223    Recent Labs Lab 07/09/14 0830 07/09/14 1448 07/10/14 0607  NA 137  --  137  K 3.4*  --  3.6  CL 104  --  104  CO2 25  --  25  GLUCOSE 95  --  87  BUN <5*  --  <5*  CREATININE 0.87 0.81 0.82  CALCIUM 9.1  --  8.4*   HIV: Non-reactive  Blood  culture: no growth.   Results/Tests Pending at Time of Discharge: None  Discharge Medications:    Medication List    TAKE these medications        clindamycin 300 MG capsule  Commonly known as:  CLEOCIN  Take 1 capsule (300 mg total) by mouth every 6 (six) hours.     levETIRAcetam 500 MG tablet  Commonly known as:  KEPPRA  Take 1 tablet (500 mg total) by mouth 2 (two) times daily.        Discharge Instructions: Please refer to Patient Instructions section of EMR for full details.  Patient was counseled important signs and symptoms that should prompt return to medical care, changes in medications, dietary instructions, activity restrictions, and follow up appointments.   Follow-Up Appointments:     Follow-up Information    Follow up with Darletta MollEOH,SUI W, MD. Schedule an appointment as soon as possible for a visit in 1 week.   Specialty:  Otolaryngology   Contact information:   [address] [place] [address] [phone]       Follow up with Gildardo CrankerHess, Bryan, DO On 07/15/2014.   Specialty:  Family Medicine   Why:  4:15pm   Contact information:   [address] [place] [address] [phone]       Ardith Darkaleb M Legacie Dillingham, MD 07/12/2014, 3:10 PM PGY-1, [facility]

## 2014-07-12 NOTE — Progress Notes (Signed)
Subjective: Pt reports improvement in her neck pain. Able to tolerate regular diet.  Objective: Vital signs in last 24 hours: Temp:  [97.7 F (36.5 C)-99.2 F (37.3 C)] 97.7 F (36.5 C) (05/23 0534) Pulse Rate:  [53-68] 53 (05/23 0534) Resp:  [18] 18 (05/23 0534) BP: (100-123)/(46-82) 105/82 mmHg (05/23 0534) SpO2:  [99 %-100 %] 99 % (05/23 0534)  Physical Exam  Constitutional: She is oriented to person, place, and time. She appears well-developed and well-nourished. No distress.  Head: Normocephalic and atraumatic.  Mouth/Throat: Oropharynx is clear and moist. No obvious asymmetry. Ears: Normal auricles and EACs. Eyes: EOM are normal. Pupils are equal, round, and reactive to light.  Neck: Normal range of motion. Neck supple. No tracheal deviation present. Right neck is no longer tender to palpation. Pulmonary/Chest: Effort normal and breath sounds normal. No stridor. No respiratory distress. Musculoskeletal: Normal range of motion. She exhibits no edema.  Neurological: She is alert and oriented to person, place, and time.  Skin: No rash noted. She is not diaphoretic.    Recent Labs  07/09/14 1448 07/10/14 0607  WBC 9.6 9.0  HGB 10.9* 10.4*  HCT 33.0* 31.6*  PLT 227 223    Recent Labs  07/09/14 1448 07/10/14 0607  NA  --  137  K  --  3.6  CL  --  104  CO2  --  25  GLUCOSE  --  87  BUN  --  <5*  CREATININE 0.81 0.82  CALCIUM  --  8.4*    Medications:  I have reviewed the patient's current medications. Scheduled: . clindamycin (CLEOCIN) IV  600 mg Intravenous 4 times per day  . enoxaparin (LOVENOX) injection  40 mg Subcutaneous Daily  . levETIRAcetam  500 mg Oral BID  . sodium chloride  3 mL Intravenous Q12H   JYN:[id number]:diphenhydrAMINE, morphine injection, ondansetron (ZOFRAN) IV, polyethylene glycol  Assessment/Plan: Right parapharyngeal cellulitis and possible small abscess. Right neck swelling and pain have resolved. Consider d/c home on oral clindamycin  (300mg  po QID for 10 days). Pt may f/u at my office in 1 week.   LOS: 3 days   Estefanny Moler,SUI W 07/12/2014, 12:12 PM

## 2014-07-15 ENCOUNTER — Inpatient Hospital Stay: Payer: Medicaid Other | Admitting: Family Medicine

## 2014-07-15 LAB — CULTURE, BLOOD (ROUTINE X 2)
CULTURE: NO GROWTH
Culture: NO GROWTH

## 2014-07-16 ENCOUNTER — Encounter: Payer: Self-pay | Admitting: Family Medicine

## 2014-07-16 ENCOUNTER — Ambulatory Visit (INDEPENDENT_AMBULATORY_CARE_PROVIDER_SITE_OTHER): Payer: Medicaid Other | Admitting: Family Medicine

## 2014-07-16 VITALS — BP 144/92 | HR 66 | Temp 97.9°F | Wt 218.0 lb

## 2014-07-16 DIAGNOSIS — J390 Retropharyngeal and parapharyngeal abscess: Secondary | ICD-10-CM | POA: Diagnosis present

## 2014-07-16 NOTE — Progress Notes (Signed)
Patient ID: Kelly Johns, female   DOB: 09/01/1985, 29 y.o.   MRN: [id number]  HPI:  Patient presents for hospital follow-up appointment. She was hospitalized from May 20 to May 23 for a parapharyngeal abscess. Discharged on clindamycin with plans for ENT outpatient follow-up. No surgical interventions during this hospitalization.  She has been doing well since discharge. Still has some pain in her throat when she yawns. Tolerating clindamycin without concerns. No diarrhea. Pain is gradually improving. Has appointment with ENT next week. Eating and drinking well, no concerns with breathing.  ROS: See HPI.  PMFSH: History of obesity, seizures, substance and tobacco abuse  PHYSICAL EXAM: BP 144/92 mmHg  Pulse 66  Temp(Src) 97.9 F (36.6 C) (Oral)  Wt 218 lb (98.884 kg)  LMP 07/14/2014 Gen: No acute distress, pleasant, cooperative HEENT: Normocephalic, atraumatic, oropharynx clear and moist without any abnormal masses or lesions. Neck nontender to palpation, full range of motion of neck. No anterior cervical lymphadenopathy. Lungs: Normal respiratory effort, no stridor Neuro: Grossly nonfocal, speech normal  ASSESSMENT/PLAN:  Parapharyngeal abscess Doing well after hospitalization. Patient will continue clindamycin. Has follow-up scheduled next week with ENT. Encouraged her to keep scheduled appointment with PCP Dr. Caleb PoppNettey for routine follow-up. She is also due for a Pap smear. I gave patient a copy of the Medicaid dental list so she can see a dentist, as she thinks that is the source of this abscess.    FOLLOW UP: F/u in as scheduled with PCP  GrenadaBrittany J. Pollie MeyerMcIntyre, MD [facility]

## 2014-07-16 NOTE — Assessment & Plan Note (Addendum)
Doing well after hospitalization. Patient will continue clindamycin. Has follow-up scheduled next week with ENT. Encouraged her to keep scheduled appointment with PCP Dr. Caleb PoppNettey for routine follow-up. She is also due for a Pap smear. I gave patient a copy of the Medicaid dental list so she can see a dentist, as she thinks that is the source of this abscess.

## 2014-07-16 NOTE — Patient Instructions (Signed)
See dental handout Continue current medicines, finish clindamycin Call with any problems  Follow up with Dr. Caleb PoppNettey in 1-2 mos to recheck BP and get a pap smear  Be well, Dr. Pollie MeyerMcIntyre

## 2014-07-20 NOTE — Progress Notes (Signed)
I was the preceptor on the day of this visit.   Nymir Ringler MD  

## 2014-07-26 ENCOUNTER — Encounter: Payer: Medicaid Other | Admitting: Family Medicine

## 2014-08-19 ENCOUNTER — Ambulatory Visit: Payer: Medicaid Other | Admitting: Family Medicine

## 2014-11-15 ENCOUNTER — Other Ambulatory Visit: Payer: Self-pay | Admitting: Family Medicine

## 2014-11-15 MED ORDER — LEVETIRACETAM 500 MG PO TABS: 500.0000 mg | ORAL_TABLET | Freq: Two times a day (BID) | ORAL | Status: DC

## 2014-11-15 NOTE — Telephone Encounter (Signed)
Needs refill on keppra.  [facility] [address]

## 2015-03-19 ENCOUNTER — Emergency Department (HOSPITAL_COMMUNITY)
Admission: EM | Admit: 2015-03-19 | Discharge: 2015-03-19 | Disposition: A | Discharge status: Home / Self Care | Payer: Medicaid Other | Attending: Emergency Medicine | Admitting: Emergency Medicine

## 2015-03-19 ENCOUNTER — Encounter (HOSPITAL_COMMUNITY): Payer: Self-pay | Admitting: Vascular Surgery

## 2015-03-19 DIAGNOSIS — S0083XA Contusion of other part of head, initial encounter: Secondary | ICD-10-CM | POA: Diagnosis not present

## 2015-03-19 DIAGNOSIS — Z79899 Other long term (current) drug therapy: Secondary | ICD-10-CM | POA: Insufficient documentation

## 2015-03-19 DIAGNOSIS — W2209XA Striking against other stationary object, initial encounter: Secondary | ICD-10-CM | POA: Diagnosis not present

## 2015-03-19 DIAGNOSIS — R569 Unspecified convulsions: Secondary | ICD-10-CM | POA: Diagnosis present

## 2015-03-19 DIAGNOSIS — S00512A Abrasion of oral cavity, initial encounter: Secondary | ICD-10-CM | POA: Diagnosis not present

## 2015-03-19 DIAGNOSIS — Y9289 Other specified places as the place of occurrence of the external cause: Secondary | ICD-10-CM | POA: Insufficient documentation

## 2015-03-19 DIAGNOSIS — Y9389 Activity, other specified: Secondary | ICD-10-CM | POA: Insufficient documentation

## 2015-03-19 DIAGNOSIS — Y990 Civilian activity done for income or pay: Secondary | ICD-10-CM | POA: Diagnosis not present

## 2015-03-19 DIAGNOSIS — F17210 Nicotine dependence, cigarettes, uncomplicated: Secondary | ICD-10-CM | POA: Diagnosis not present

## 2015-03-19 MED ORDER — LEVETIRACETAM 500 MG PO TABS: ORAL_TABLET | ORAL | Status: DC

## 2015-03-19 MED ORDER — ACETAMINOPHEN 325 MG PO TABS
650.0000 mg | ORAL_TABLET | Freq: Once | ORAL | Status: AC
  Administered 2015-03-19: 650 mg via ORAL
  Filled 2015-03-19: qty 2

## 2015-03-19 MED ORDER — LEVETIRACETAM 250 MG PO TABS
250.0000 mg | ORAL_TABLET | Freq: Once | ORAL | Status: AC
  Administered 2015-03-19: 250 mg via ORAL
  Filled 2015-03-19: qty 1

## 2015-03-19 NOTE — ED Provider Notes (Signed)
CSN: [id number]     Arrival date & time 03/19/15  1357 History   First MD Initiated Contact with Patient 03/19/15 1413     Chief Complaint  Patient presents with  . Seizures   HPI   Kelly Johns is a 30 y.o. F PMH significant for seizures presenting s/p seizure. She was BIB EMS after she had a seizure at work (witnessed by coworkers). She endorses being compliant with her medication, but states she has not seen a neurologist yet. She takes keppra and her dose was recently decreased from 500 mg twice daily to 250 mg twice daily. She hit her head during the seizure and endorses a slight headache where the "bump" on her forehead is. No fevers, chills, N/V, loss of bladder/bowel control.   Past Medical History  Diagnosis Date  . Seizures [facility])    Past Surgical History  Procedure Laterality Date  . C-sec x 2  2005, 2006   No family history on file. Social History  Substance Use Topics  . Smoking status: Current Every Day Smoker -- 0.30 packs/day    Types: Cigarettes  . Smokeless tobacco: Never Used     Comment: trying  . Alcohol Use: No     Comment: rarely   OB History    No data available     Review of Systems  Ten systems are reviewed and are negative for acute change except as noted in the HPI  Allergies  Chocolate  Home Medications   Prior to Admission medications   Medication Sig Start Date End Date Taking? Authorizing Provider  clindamycin (CLEOCIN) 300 MG capsule Take 1 capsule (300 mg total) by mouth every 6 (six) hours. 07/12/14   Ardith Dark, MD  levETIRAcetam (KEPPRA) 500 MG tablet Take 1 tablet (500 mg total) by mouth 2 (two) times daily. 11/15/14   Narda Bonds, MD   BP 132/77 mmHg  Pulse 87  Temp(Src) 98.2 F (36.8 C) (Oral)  Resp 15  SpO2 100% Physical Exam  Constitutional: She is oriented to person, place, and time. She appears well-developed and well-nourished. No distress.  HENT:  Head: Normocephalic.  Right Ear: External ear normal.  Left  Ear: External ear normal.  Nose: Nose normal.  Mouth/Throat: Oropharynx is clear and moist. No oropharyngeal exudate.  1.5 cm hematoma overlying left superior forehead. No hemotympanum. Left sided tongue abrasion.  Eyes: Conjunctivae and EOM are normal. Pupils are equal, round, and reactive to light. Right eye exhibits no discharge. Left eye exhibits no discharge. No scleral icterus.  Neck: Normal range of motion. No tracheal deviation present.  Cardiovascular: Normal rate, regular rhythm, normal heart sounds and intact distal pulses.  Exam reveals no gallop and no friction rub.   No murmur heard. Pulmonary/Chest: Effort normal and breath sounds normal. No respiratory distress. She has no wheezes. She has no rales. She exhibits no tenderness.  Abdominal: Soft. Bowel sounds are normal. She exhibits no distension and no mass. There is no tenderness. There is no rebound and no guarding.  Musculoskeletal: Normal range of motion. She exhibits no edema or tenderness.  No cervical, thoracic, or lumbar midline tenderness  Lymphadenopathy:    She has no cervical adenopathy.  Neurological: She is alert and oriented to person, place, and time. No cranial nerve deficit. Coordination normal.  Skin: Skin is warm and dry. No rash noted. She is not diaphoretic. No erythema.  Psychiatric: She has a normal mood and affect. Her behavior is normal.  Nursing  note and vitals reviewed.   ED Course  Procedures   MDM   Final diagnoses:  Seizures (HCC)   Patient non-toxic appearing and VSS. Neuro unremarkable. Known history of seizures. Patient may need medication adjument. Dr. Ethelda Chick evaluated patient and advised discharged, neuro follow-up and changing keppra to 250 mg in the afternoons and 500 mg at night.   Medications  levETIRAcetam (KEPPRA) tablet 250 mg (250 mg Oral Given 03/19/15 1547)  acetaminophen (TYLENOL) tablet 650 mg (650 mg Oral Given 03/19/15 1547)   Patient feels improved after  observation and/or treatment in ED.  Patient may be safely discharged home with above keppra regimen and neurology follow-up.  Discussed reasons for return. Patient in understanding and agreement with the plan.    Melton Krebs, PA-C 03/21/15 2303  Doug Sou, MD 03/23/15 [phone]

## 2015-03-19 NOTE — ED Provider Notes (Signed)
She had generalized seizure medially prior to coming here. She denies noncompliance with her medications. Keppra dose was recently decreased from 500 mg twice daily to 250 mg twice daily. She is presently asymptomatic except for mild frontal headache where she bumped her head. On exam alert Glasgow Coma Score 15 HEENT exam tiny hematoma overlying forehead, abrasion to left side of tongue otherwise normocephalic atraumatic neck is supple nontender neurologic Glasgow Coma Score 15 Versed 5 over 5 overall gait normal, not lightheaded on standing  Doug Sou, MD 03/19/15 1718

## 2015-03-19 NOTE — ED Notes (Signed)
Pt reports to the ED for eval of seizure while at work. She has hx of seizures. She missed her last night's dose of keppra and had to walk pretty far to work today which often increases her risk of seizures. Per EMS her coworkers stated that she had a full body seizure. Witnesses estimate the seizure lasted approx 15 minutes. Pt does have a tongue laceration and she did hit the left side of her face/head on the ground. No incontinence. Pt was postitcal on arrival but is A&Ox4, resp e/u, and skin warm and dry.  En route: CBG- 122 mg, 134/84, 93, 100%

## 2015-03-19 NOTE — ED Notes (Signed)
MD at bedside. 

## 2015-03-19 NOTE — Discharge Instructions (Signed)
Ms. Kelly Johns,  Nice meeting you! Please follow-up with your primary care provider. We are providing information for [facility] and [facility]. Return to the emergency department if you develop localized weakness, slurred speech, or you injure yourself during a seizure. Feel better soon!  S. Lane Hacker, PA-C

## 2015-03-19 NOTE — ED Notes (Signed)
Requested meds from pharmacy  

## 2015-03-22 ENCOUNTER — Ambulatory Visit (INDEPENDENT_AMBULATORY_CARE_PROVIDER_SITE_OTHER): Payer: Medicaid Other | Admitting: Family Medicine

## 2015-03-22 ENCOUNTER — Encounter: Payer: Self-pay | Admitting: Family Medicine

## 2015-03-22 ENCOUNTER — Other Ambulatory Visit (HOSPITAL_COMMUNITY)
Admission: RE | Admit: 2015-03-22 | Discharge: 2015-03-22 | Disposition: A | Discharge status: Home / Self Care | Payer: Medicaid Other | Source: Ambulatory Visit | Attending: Family Medicine | Admitting: Family Medicine

## 2015-03-22 VITALS — BP 138/88 | HR 87 | Temp 98.2°F | Ht 63.0 in | Wt 209.5 lb

## 2015-03-22 DIAGNOSIS — Z13220 Encounter for screening for lipoid disorders: Secondary | ICD-10-CM | POA: Diagnosis not present

## 2015-03-22 DIAGNOSIS — N760 Acute vaginitis: Secondary | ICD-10-CM

## 2015-03-22 DIAGNOSIS — A499 Bacterial infection, unspecified: Secondary | ICD-10-CM | POA: Diagnosis not present

## 2015-03-22 DIAGNOSIS — E049 Nontoxic goiter, unspecified: Secondary | ICD-10-CM

## 2015-03-22 DIAGNOSIS — D649 Anemia, unspecified: Secondary | ICD-10-CM | POA: Diagnosis not present

## 2015-03-22 DIAGNOSIS — Z0000 Encounter for general adult medical examination without abnormal findings: Secondary | ICD-10-CM | POA: Diagnosis present

## 2015-03-22 DIAGNOSIS — Z124 Encounter for screening for malignant neoplasm of cervix: Secondary | ICD-10-CM | POA: Diagnosis not present

## 2015-03-22 DIAGNOSIS — Z1151 Encounter for screening for human papillomavirus (HPV): Secondary | ICD-10-CM | POA: Insufficient documentation

## 2015-03-22 DIAGNOSIS — Z131 Encounter for screening for diabetes mellitus: Secondary | ICD-10-CM

## 2015-03-22 DIAGNOSIS — R569 Unspecified convulsions: Secondary | ICD-10-CM

## 2015-03-22 DIAGNOSIS — Z113 Encounter for screening for infections with a predominantly sexual mode of transmission: Secondary | ICD-10-CM | POA: Diagnosis present

## 2015-03-22 DIAGNOSIS — A5901 Trichomonal vulvovaginitis: Secondary | ICD-10-CM

## 2015-03-22 DIAGNOSIS — Z01419 Encounter for gynecological examination (general) (routine) without abnormal findings: Secondary | ICD-10-CM | POA: Diagnosis present

## 2015-03-22 DIAGNOSIS — B9689 Other specified bacterial agents as the cause of diseases classified elsewhere: Secondary | ICD-10-CM

## 2015-03-22 LAB — COMPLETE METABOLIC PANEL WITH GFR
ALT: 8 U/L (ref 6–29)
AST: 14 U/L (ref 10–30)
Albumin: 3.6 g/dL (ref 3.6–5.1)
Alkaline Phosphatase: 67 U/L (ref 33–115)
BILIRUBIN TOTAL: 0.2 mg/dL (ref 0.2–1.2)
BUN: 8 mg/dL (ref 7–25)
CHLORIDE: 105 mmol/L (ref 98–110)
CO2: 28 mmol/L (ref 20–31)
Calcium: 9.1 mg/dL (ref 8.6–10.2)
Creat: 0.87 mg/dL (ref 0.50–1.10)
Glucose, Bld: 73 mg/dL (ref 65–99)
Potassium: 3.9 mmol/L (ref 3.5–5.3)
Sodium: 141 mmol/L (ref 135–146)
Total Protein: 7.3 g/dL (ref 6.1–8.1)

## 2015-03-22 LAB — CBC WITH DIFFERENTIAL/PLATELET
Basophils Absolute: 0 10*3/uL (ref 0.0–0.1)
Basophils Relative: 0 % (ref 0–1)
EOS ABS: 0.1 10*3/uL (ref 0.0–0.7)
EOS PCT: 2 % (ref 0–5)
HCT: 33.4 % — ABNORMAL LOW (ref 36.0–46.0)
Hemoglobin: 10.7 g/dL — ABNORMAL LOW (ref 12.0–15.0)
LYMPHS ABS: 2.8 10*3/uL (ref 0.7–4.0)
Lymphocytes Relative: 46 % (ref 12–46)
MCH: 28.8 pg (ref 26.0–34.0)
MCHC: 32 g/dL (ref 30.0–36.0)
MCV: 89.8 fL (ref 78.0–100.0)
MONOS PCT: 8 % (ref 3–12)
MPV: 10.5 fL (ref 8.6–12.4)
Monocytes Absolute: 0.5 10*3/uL (ref 0.1–1.0)
Neutro Abs: 2.7 10*3/uL (ref 1.7–7.7)
Neutrophils Relative %: 44 % (ref 43–77)
Platelets: 296 10*3/uL (ref 150–400)
RBC: 3.72 MIL/uL — ABNORMAL LOW (ref 3.87–5.11)
RDW: 17.2 % — ABNORMAL HIGH (ref 11.5–15.5)
WBC: 6.1 10*3/uL (ref 4.0–10.5)

## 2015-03-22 LAB — POCT WET PREP (WET MOUNT): CLUE CELLS WET PREP WHIFF POC: POSITIVE

## 2015-03-22 LAB — POCT GLYCOSYLATED HEMOGLOBIN (HGB A1C): HEMOGLOBIN A1C: 5.1

## 2015-03-22 LAB — LDL CHOLESTEROL, DIRECT: Direct LDL: 94 mg/dL (ref <130)

## 2015-03-22 MED ORDER — METRONIDAZOLE 500 MG PO TABS: 500.0000 mg | ORAL_TABLET | Freq: Two times a day (BID) | ORAL | Status: DC

## 2015-03-22 NOTE — Progress Notes (Signed)
Subjective    Kelly Johns is a 30 y.o. female that presents for yearly physical exam.   Concerns:  1. Seizures: Patient recently at the ED for seizures. She states she is generally controlled. She believes that marijuana helps her seizures. She does not currently follow with neurology. She is currently adherent with medication regimen.  Goals    None      G2P2002 Wt Readings from Last 3 Encounters:  03/22/15 209 lb 8 oz (95.029 kg)  07/16/14 218 lb (98.884 kg)  07/09/14 210 lb (95.255 kg)   Last period: 03/14/2015 Regular periods: Yes Heavy bleeding: Yes  Sexually active: Yes Birth control or hormonal therapy: Condoms Hx of STD: Patient desires STD screening Dyspareunia: No Hot flashes: No Vaginal discharge: yes Dysuria: No   Last mammogram: n/a Breast mass or concerns: No Last Pap: 01/18/2011  History of abnormal pap: No   FH of breast, uterine, ovarian, colon cancer: No  Past Medical History  Diagnosis Date  . Seizures [facility])     Past Surgical History  Procedure Laterality Date  . C-sec x 2  2005, 2006    Current Outpatient Prescriptions on File Prior to Visit  Medication Sig Dispense Refill  . clindamycin (CLEOCIN) 300 MG capsule Take 1 capsule (300 mg total) by mouth every 6 (six) hours. 41 capsule 0  . levETIRAcetam (KEPPRA) 500 MG tablet Take 0.5 tablet (250 mg) in the afternoons and 1 whole tablet (500 mg) at night. 60 tablet 0   No current facility-administered medications on file prior to visit.    Allergies  Allergen Reactions  . Chocolate Hives    "breaks out in hives on back"    Social History   Social History  . Marital Status: Single    Spouse Name: N/A  . Number of Children: N/A  . Years of Education: N/A   Social History Main Topics  . Smoking status: Current Every Day Smoker -- 0.30 packs/day    Types: Cigarettes  . Smokeless tobacco: Never Used     Comment: trying  . Alcohol Use: No     Comment: rarely  . Drug Use:  7.00 per week    Special: Marijuana     Comment: daily  . Sexual Activity: Not Asked   Other Topics Concern  . None   Social History Narrative    No family history on file.  ROS  Per HPI   Objective   BP 140/79 mmHg  Pulse 87  Temp(Src) 98.2 F (36.8 C) (Oral)  Ht  (1.6 m)  Wt 209 lb 8 oz (95.029 kg)  BMI 37.12 kg/m2  LMP 03/14/2015  General: Well appearing, no distress HEENT:   Head:  Normocephalic  Eyes: Pupils equal and reactive to light/accomodation. Extraocular movements intact bilaterally.  Ears: Tympanic membranes normal bilaterally.  Nose/Throat: Nares patent bilaterally. Oropharnx clear and moist.  Neck: No cervical adenopathy bilaterally, enlarged nontender thyroid Respiratory/Chest: Clear to auscultation bilaterally. Unlabored work of breathing. No wheezing or rales. Cardiovascular: Regular rate and rhythm. Normal S1 and S2. No heart murmurs present. No extra heart sounds Gastrointestinal: Soft, non-tender, non-distended, no guarding, no rebound, no masses felt Genitourinary: No external vaginal lesions, cervix appears normal with no discharge, no bleeding    Musculoskeletal: slight swelling in left foot that is non-pitting Neuro: alert, oriented, reflexes equal bilaterally Dermatologic: No obvious rashes, multiple tattoos Psychiatric: full affect  Assessment and Plan    No orders of the defined types were  placed in this encounter.    Health Maintenance Due  Topic Date Due  . PAP SMEAR  01/17/2014    1. Screening for malignant neoplasm of cervix - Cytology - PAP [place]  2. Trichomonas vaginitis - Cervicovaginal ancillary only - POCT Wet Prep ([place]) - metroNIDAZOLE (FLAGYL) 500 MG tablet; Take 1 tablet (500 mg total) by mouth 2 (two) times daily.  Dispense: 14 tablet; Refill: 0  3. Screening for hyperlipidemia - LDL cholesterol, direct  4. Anemia, unspecified anemia type - CBC with Differential/Platelet  5. Screening for  diabetes mellitus - POCT glycosylated hemoglobin (Hb A1C)  6. Enlarged thyroid - COMPLETE METABOLIC PANEL WITH GFR - TSH - US Soft Tissue Head/Neck; Future  7. Healthcare maintenance Dicussed tobacco cessation. Patient wanting to stop on her own. Does not want help at this time  8. Bacterial vaginosis - metroNIDAZOLE (FLAGYL) 500 MG tablet; Take 1 tablet (500 mg total) by mouth 2 (two) times daily.  Dispense: 14 tablet; Refill: 0  9. Seizure [facility]) - Ambulatory referral to Neurology

## 2015-03-22 NOTE — Patient Instructions (Signed)
Thank you for coming to see me today. It was a pleasure. Today we talked about:   Yearly physical: I am getting some labs. Expect a call  Vaginal discharge: I am doing some labs to make sure you don't have an infection  Thyroid: I am checking your thyroid labs and getting an ultrasound  Please make an appointment to see me in about one month or sooner after your ultrasound.  If you have any questions or concerns, please do not hesitate to call the office at [phone],  Jacquelin Hawking, MD

## 2015-03-23 LAB — CERVICOVAGINAL ANCILLARY ONLY
Chlamydia: NEGATIVE
NEISSERIA GONORRHEA: NEGATIVE

## 2015-03-23 LAB — TSH: TSH: 0.952 u[IU]/mL (ref 0.350–4.500)

## 2015-03-24 LAB — CYTOLOGY - PAP

## 2015-03-25 ENCOUNTER — Ambulatory Visit (HOSPITAL_COMMUNITY): Payer: Medicaid Other

## 2015-03-28 ENCOUNTER — Encounter: Payer: Self-pay | Admitting: Family Medicine

## 2015-03-29 ENCOUNTER — Telehealth: Payer: Self-pay | Admitting: *Deleted

## 2015-03-29 NOTE — Telephone Encounter (Signed)
Pt informed of below. Zimmerman Rumple, Greenly Rarick D, CMA  

## 2015-03-29 NOTE — Telephone Encounter (Signed)
-----   Message from Narda Bonds, MD sent at 03/28/2015  8:33 PM EST ----- I have discussed wet prep results with patient. Lab works looks good for the most part. Thyroid level is normal. Pap smear is negative (can repeat in 3-5 years). Gonorrhea/chlamydia is negative. Electrolytes look good. Cholesterol is in a good range. One thing is hemoglobin is a little low; this is likely secondary to menstruation. Recommend taking OTC iron 2-3 times per day as tolerated or in a multivitamin. Please inform patient. Thanks!

## 2015-04-04 ENCOUNTER — Encounter (HOSPITAL_COMMUNITY): Payer: Self-pay | Admitting: Emergency Medicine

## 2015-04-04 ENCOUNTER — Emergency Department (HOSPITAL_COMMUNITY)
Admission: EM | Admit: 2015-04-04 | Discharge: 2015-04-04 | Disposition: A | Discharge status: Home / Self Care | Payer: Medicaid Other | Attending: Emergency Medicine | Admitting: Emergency Medicine

## 2015-04-04 DIAGNOSIS — Y990 Civilian activity done for income or pay: Secondary | ICD-10-CM | POA: Insufficient documentation

## 2015-04-04 DIAGNOSIS — Z79899 Other long term (current) drug therapy: Secondary | ICD-10-CM | POA: Insufficient documentation

## 2015-04-04 DIAGNOSIS — W1839XA Other fall on same level, initial encounter: Secondary | ICD-10-CM | POA: Diagnosis not present

## 2015-04-04 DIAGNOSIS — Y9289 Other specified places as the place of occurrence of the external cause: Secondary | ICD-10-CM | POA: Diagnosis not present

## 2015-04-04 DIAGNOSIS — Z3202 Encounter for pregnancy test, result negative: Secondary | ICD-10-CM | POA: Insufficient documentation

## 2015-04-04 DIAGNOSIS — F17210 Nicotine dependence, cigarettes, uncomplicated: Secondary | ICD-10-CM | POA: Diagnosis not present

## 2015-04-04 DIAGNOSIS — S00512A Abrasion of oral cavity, initial encounter: Secondary | ICD-10-CM | POA: Diagnosis not present

## 2015-04-04 DIAGNOSIS — R569 Unspecified convulsions: Secondary | ICD-10-CM

## 2015-04-04 DIAGNOSIS — Y9389 Activity, other specified: Secondary | ICD-10-CM | POA: Diagnosis not present

## 2015-04-04 LAB — URINALYSIS, ROUTINE W REFLEX MICROSCOPIC
Bilirubin Urine: NEGATIVE
Glucose, UA: NEGATIVE mg/dL
Hgb urine dipstick: NEGATIVE
Ketones, ur: NEGATIVE mg/dL
LEUKOCYTES UA: NEGATIVE
NITRITE: NEGATIVE
PH: 7.5 (ref 5.0–8.0)
Protein, ur: NEGATIVE mg/dL
SPECIFIC GRAVITY, URINE: 1.025 (ref 1.005–1.030)

## 2015-04-04 LAB — COMPREHENSIVE METABOLIC PANEL
ALBUMIN: 3.8 g/dL (ref 3.5–5.0)
ALT: 12 U/L — ABNORMAL LOW (ref 14–54)
AST: 15 U/L (ref 15–41)
Alkaline Phosphatase: 58 U/L (ref 38–126)
Anion gap: 6 (ref 5–15)
BUN: 10 mg/dL (ref 6–20)
CHLORIDE: 107 mmol/L (ref 101–111)
CO2: 26 mmol/L (ref 22–32)
Calcium: 9 mg/dL (ref 8.9–10.3)
Creatinine, Ser: 0.97 mg/dL (ref 0.44–1.00)
GFR calc Af Amer: >60 mL/min (ref >60)
GFR calc non Af Amer: >60 mL/min (ref >60)
GLUCOSE: 85 mg/dL (ref 65–99)
POTASSIUM: 4.1 mmol/L (ref 3.5–5.1)
SODIUM: 139 mmol/L (ref 135–145)
Total Bilirubin: 0.3 mg/dL (ref 0.3–1.2)
Total Protein: 7.2 g/dL (ref 6.5–8.1)

## 2015-04-04 LAB — CBC WITH DIFFERENTIAL/PLATELET
Basophils Absolute: 0 10*3/uL (ref 0.0–0.1)
Basophils Relative: 0 %
EOS PCT: 1 %
Eosinophils Absolute: 0.1 10*3/uL (ref 0.0–0.7)
HEMATOCRIT: 33.9 % — AB (ref 36.0–46.0)
Hemoglobin: 11 g/dL — ABNORMAL LOW (ref 12.0–15.0)
LYMPHS ABS: 2.2 10*3/uL (ref 0.7–4.0)
LYMPHS PCT: 27 %
MCH: 29.3 pg (ref 26.0–34.0)
MCHC: 32.4 g/dL (ref 30.0–36.0)
MCV: 90.2 fL (ref 78.0–100.0)
MONO ABS: 0.7 10*3/uL (ref 0.1–1.0)
Monocytes Relative: 9 %
NEUTROS ABS: 5.2 10*3/uL (ref 1.7–7.7)
Neutrophils Relative %: 63 %
PLATELETS: 254 10*3/uL (ref 150–400)
RBC: 3.76 MIL/uL — AB (ref 3.87–5.11)
RDW: 16.7 % — ABNORMAL HIGH (ref 11.5–15.5)
WBC: 8.1 10*3/uL (ref 4.0–10.5)

## 2015-04-04 LAB — POC URINE PREG, ED: Preg Test, Ur: NEGATIVE

## 2015-04-04 MED ORDER — SODIUM CHLORIDE 0.9 % IV SOLN
INTRAVENOUS | Status: DC
  Administered 2015-04-04: 15:00:00 via INTRAVENOUS

## 2015-04-04 NOTE — Progress Notes (Signed)
Pt confirms pcp as ralph nettey Offered pt blanket but refused

## 2015-04-04 NOTE — ED Notes (Addendum)
Per EMS pt with hx of seizures had witnessed fall and seizure at work x 7 minutes. Alert and oriented x 4 since EMS arrival. Pt c/o some neck pain on ROM. Minor laceration to tongue without bleeding. No incontinence. Pt takes Keppra as directed, no missed doses. Last seizure 03/19/15.

## 2015-04-04 NOTE — ED Provider Notes (Signed)
CSN: [id number]     Arrival date & time 04/04/15  1256 History   First MD Initiated Contact with Patient 04/04/15 1459     Chief Complaint  Patient presents with  . Seizures     (Consider location/radiation/quality/duration/timing/severity/associated sxs/prior Treatment) Patient is a 30 y.o. female presenting with seizures. The history is provided by the patient and medical records.  Seizures   30 y.o. F with hx of seizures, presenting to the ED following seizure.  Patient states she was at work today, felt like normal self.  She states she apparently had a seizure and woke up on the floor with co-workers around her.  Co-workers told EMS seizure activity lasted approx 7 minutes total, no significant head injury.  No bowel or bladder incontinence.  Patient has been awake, alert, oriented since arrival in ED.  She states the left side of her tongue feels sore.  She states she had some neck pain earlier, none currently.  She states she just feels tired which is normal following seizure episode.  Patient states she does take Keppra, has been taking as directed.   Her last seizure prior to today was 03/19/2015, medications were increased at that time-- now takes  in morning,  in evening.  She states she has not been able to tolerate higher dosing than this because it seems to make her drowsy during the day and she cannot function normally at work.  She state she has upcoming appt with neurology next month.  No recent illness, fever, sweats, chills.  VSS.   Past Medical History  Diagnosis Date  . Seizures [facility])    Past Surgical History  Procedure Laterality Date  . C-sec x 2  2005, 2006   Family History  Problem Relation Age of Onset  . Diabetes Maternal Grandmother    Social History  Substance Use Topics  . Smoking status: Current Every Day Smoker -- 0.30 packs/day    Types: Cigarettes  . Smokeless tobacco: Never Used     Comment: trying  . Alcohol Use: No     Comment: rarely    OB History    Gravida Para Term Preterm AB TAB SAB Ectopic Multiple Living   0 0 0 0 0 0 2     Review of Systems  Neurological: Positive for seizures.  All other systems reviewed and are negative.     Allergies  Chocolate  Home Medications   Prior to Admission medications   Medication Sig Start Date End Date Taking? Authorizing Provider  levETIRAcetam (KEPPRA) 500 MG tablet Take 0.5 tablet (250 mg) in the afternoons and 1 whole tablet (500 mg) at night. 03/19/15  Yes Melton Krebs, PA-C  metroNIDAZOLE (FLAGYL) 500 MG tablet Take 1 tablet (500 mg total) by mouth 2 (two) times daily. Patient not taking: Reported on 04/04/2015 03/22/15   Narda Bonds, MD   BP 141/84 mmHg  Pulse 83  Temp(Src) 98.7 F (37.1 C) (Oral)  Resp 16  SpO2 100%  LMP 03/14/2015   Physical Exam  Constitutional: She is oriented to person, place, and time. She appears well-developed and well-nourished.  Awake, alert, NAD  HENT:  Head: Normocephalic and atraumatic.  Mouth/Throat: Oropharynx is clear and moist.  Small abrasion noted to left side of tongue, no active bleeding No skull deformities or hematoma; no bruising  Eyes: Conjunctivae and EOM are normal. Pupils are equal, round, and reactive to light.  Neck: Normal range of motion.  Cardiovascular: Normal rate,  regular rhythm and normal heart sounds.   Pulmonary/Chest: Effort normal and breath sounds normal.  Abdominal: Soft. Bowel sounds are normal.  Musculoskeletal: Normal range of motion.       Cervical back: Normal. She exhibits normal range of motion, no tenderness, no pain and no spasm.  CS non-tender, full ROM, no deformities  Neurological: She is alert and oriented to person, place, and time.  AAOx3, answering questions appropriately; equal strength UE and LE bilaterally; CN grossly intact; moves all extremities appropriately without ataxia; no focal neuro deficits or facial asymmetry appreciated  Skin: Skin is warm and  dry.  Psychiatric: She has a normal mood and affect.  Nursing note and vitals reviewed.   ED Course  Procedures (including critical care time) Labs Review Labs Reviewed  COMPREHENSIVE METABOLIC PANEL - Abnormal; Notable for the following:    ALT 12 (*)    All other components within normal limits  CBC WITH DIFFERENTIAL/PLATELET - Abnormal; Notable for the following:    RBC 3.76 (*)    Hemoglobin 11.0 (*)    HCT 33.9 (*)    RDW 16.7 (*)    All other components within normal limits  URINALYSIS, ROUTINE W REFLEX MICROSCOPIC (NOT AT [facility])  POC URINE PREG, ED    Imaging Review No results found. I have personally reviewed and evaluated these images and lab results as part of my medical decision-making.  ED ECG REPORT   Date: 04/04/2015  Rate: 72  Rhythm: normal sinus rhythm  QRS Axis: normal  Intervals: normal  ST/T Wave abnormalities: normal  Conduction Disutrbances:none  Narrative Interpretation:   Old EKG Reviewed: unchanged  I have personally reviewed the EKG tracing and agree with the computerized printout as noted.   MDM   Final diagnoses:  Seizure (HCC)   30 year old female here after a witnessed seizure at work. Patient has known seizure disorder. Medications were recently increased on 03/19/2015. States she is not able to tolerate very high dose of her Resumes her drowsy. Patient is awake, alert, oriented to her baseline. She is neurologically intact. She has a small abrasion to the left side of her tongue, exam is otherwise atraumatic. She initially had some neck pain, however states this has resolved at the time of my evaluation. Her cervical spine exam is benign without tenderness or deformities.  EKG is normal sinus rhythm without acute ischemic changes or conduction disturbance. Her lab work is reassuring. Given that patient remains asymptomatic and at her baseline, feel she is stable for discharge. She has follow-up with neurology next month. Encouraged to  continue her Keppra as directed until this time.  Discussed plan with patient, he/she acknowledged understanding and agreed with plan of care.  Return precautions given for new or worsening symptoms.  Garlon Hatchet, PA-C 04/04/15 1652  Lorre Nick, MD 04/06/15 [phone]

## 2015-04-04 NOTE — Discharge Instructions (Signed)
Your lab work today was normal. Continue taking your Keppra as directed. Follow-up with your neurologist next month as scheduled. Return here sooner for any new/worsening symptoms.  Seizure, Adult A seizure is abnormal electrical activity in the brain. Seizures usually last from 30 seconds to 2 minutes. There are various types of seizures. Before a seizure, you may have a warning sensation (aura) that a seizure is about to occur. An aura may include the following symptoms:   Fear or anxiety.  Nausea.  Feeling like the room is spinning (vertigo).  Vision changes, such as seeing flashing lights or spots. Common symptoms during a seizure include:  A change in attention or behavior (altered mental status).  Convulsions with rhythmic jerking movements.  Drooling.  Rapid eye movements.  Grunting.  Loss of bladder and bowel control.  Bitter taste in the mouth.  Tongue biting. After a seizure, you may feel confused and sleepy. You may also have an injury resulting from convulsions during the seizure. HOME CARE INSTRUCTIONS   If you are given medicines, take them exactly as prescribed by your health care provider.  Keep all follow-up appointments as directed by your health care provider.  Do not swim or drive or engage in risky activity during which a seizure could cause further injury to you or others until your health care provider says it is OK.  Get adequate rest.  Teach friends and family what to do if you have a seizure. They should:  Lay you on the ground to prevent a fall.  Put a cushion under your head.  Loosen any tight clothing around your neck.  Turn you on your side. If vomiting occurs, this helps keep your airway clear.  Stay with you until you recover.  Know whether or not you need emergency care. SEEK IMMEDIATE MEDICAL CARE IF:  The seizure lasts longer than 5 minutes.  The seizure is severe or you do not wake up immediately after the  seizure.  You have an altered mental status after the seizure.  You are having more frequent or worsening seizures. Someone should drive you to the emergency department or call local emergency services (911 in [place] MAKE SURE YOU:  Understand these instructions.  Will watch your condition.  Will get help right away if you are not doing well or get worse.   This information is not intended to replace advice given to you by your health care provider. Make sure you discuss any questions you have with your health care provider.   Document Released: 02/03/2000 Document Revised: 02/26/2014 Document Reviewed: 09/17/2012 Elsevier Interactive Patient Education [employer]

## 2015-04-25 ENCOUNTER — Ambulatory Visit (INDEPENDENT_AMBULATORY_CARE_PROVIDER_SITE_OTHER): Payer: Medicaid Other | Admitting: Neurology

## 2015-04-25 ENCOUNTER — Encounter: Payer: Self-pay | Admitting: Neurology

## 2015-04-25 VITALS — BP 110/74 | HR 80 | Resp 16 | Ht 63.0 in | Wt 207.0 lb

## 2015-04-25 DIAGNOSIS — G40009 Localization-related (focal) (partial) idiopathic epilepsy and epileptic syndromes with seizures of localized onset, not intractable, without status epilepticus: Secondary | ICD-10-CM

## 2015-04-25 MED ORDER — LAMOTRIGINE 25 MG PO TABS: ORAL_TABLET | ORAL | Status: DC

## 2015-04-25 NOTE — Progress Notes (Signed)
NEUROLOGY CONSULTATION NOTE  Kelly Bachelorshley C Shovlin MRN: [id number] DOB: 02/21/1985  Referring provider: Dr. Jacquelin Hawkingalph Nettey Primary care provider: Dr. Jacquelin Hawkingalph Nettey  Reason for consult:  seizures  Dear Dr Caleb PoppNettey:  Thank you for your kind referral of Kelly Johns for consultation of the above symptoms. Although her history is well known to you, please allow me to reiterate it for the purpose of our medical record. Records and images were personally reviewed where available.  HISTORY OF PRESENT ILLNESS: This is a pleasant 30 year old left-handed woman with a history of seizures for the past 6 years. She reports seizures were initially nocturnal, however the last few times she has had them at work. She always seems to bite the left side of her tongue, and has had urinary incontinence. She recalls feeling lightheaded and nauseated prior to the recent seizures. She has injured herself in the past, with a scar under her right eye from a seizure 4 years ago. She had previously seen neurologist Dr. Smiley HousemanSoo in 2014 and had been given an uptitration schedule for Lamotrigine, however it does not appear she continued this. She was in the ER on 03/19/15 after a seizure at work, reporting taking Keppra 250mg  BID. She was instructed to increase this to 250mg  in AM, 500mg  in PM. She was having drowsiness on 500mg  BID. She was back in the ER last 04/04/15 after a convulsion at work. She denies any post-ictal focal weakness. She denies any sleep-deprivation or alcohol intake, and reports taking Keppra daily. She has been told that she goes out on a daze and does not respond at times. She has occasional numbness on one side, but cannot recall which side. Otherwise she denies any olfactory/gustatory hallucinations, deja vu, rising epigastric sensation, myoclonic jerks. She usually has a headache after a seizure. She has some left-sided neck pain. She denies any dizziness, diplopia, dysarthria/dysphagia, back pain, bowel/bladder  dysfunction. She has noticed problems with her memory. She finished 12th grade and currently works at [employer].   Epilepsy Risk Factors:  She had a normal birth and early development.  There is no history of febrile convulsions, CNS infections such as meningitis/encephalitis, significant traumatic brain injury, neurosurgical procedures, or family history of seizures.  I personally reviewed MRI brain with and without contrast done 09/14/2011 which was reported as normal, however by my read there appears to be asymmetry of the gyri in the right lateral temporal/parietal region.   PAST MEDICAL HISTORY: Past Medical History  Diagnosis Date  . Seizures (HCC)     PAST SURGICAL HISTORY: Past Surgical History  Procedure Laterality Date  . C-sec x 2  2005, 2006    MEDICATIONS: Current Outpatient Prescriptions on File Prior to Visit  Medication Sig Dispense Refill  . levETIRAcetam (KEPPRA) 500 MG tablet Take 0.5 tablet (250 mg) in the afternoons and 1 whole tablet (500 mg) at night. 60 tablet 0   No current facility-administered medications on file prior to visit.    ALLERGIES: Allergies  Allergen Reactions  . Chocolate Hives    "breaks out in hives on back"    FAMILY HISTORY: Family History  Problem Relation Age of Onset  . Diabetes Maternal Grandmother     SOCIAL HISTORY: Social History   Social History  . Marital Status: Single    Spouse Name: N/A  . Number of Children: 2  . Years of Education: N/A   Occupational History  .  [employer]   Social History Main Topics  . Smoking  status: Current Every Day Smoker -- 0.30 packs/day    Types: Cigarettes  . Smokeless tobacco: Never Used     Comment: trying  . Alcohol Use: 0.0 oz/week    0 Standard drinks or equivalent per week     Comment: rarely  . Drug Use: 7.00 per week    Special: Marijuana     Comment: daily  . Sexual Activity: Yes    Birth Control/ Protection: Condom   Other Topics Concern  . Not on file   Social  History Narrative    REVIEW OF SYSTEMS: Constitutional: No fevers, chills, or sweats, no generalized fatigue, change in appetite Eyes: No visual changes, double vision, eye pain Ear, nose and throat: No hearing loss, ear pain, nasal congestion, sore throat Cardiovascular: No chest pain, palpitations Respiratory:  No shortness of breath at rest or with exertion, wheezes GastrointestinaI: No nausea, vomiting, diarrhea, abdominal pain, fecal incontinence Genitourinary:  No dysuria, urinary retention or frequency Musculoskeletal:  + neck pain,no back pain Integumentary: No rash, pruritus, skin lesions Neurological: as above Psychiatric: No depression, insomnia, anxiety Endocrine: No palpitations, fatigue, diaphoresis, mood swings, change in appetite, change in weight, increased thirst Hematologic/Lymphatic:  No anemia, purpura, petechiae. Allergic/Immunologic: no itchy/runny eyes, nasal congestion, recent allergic reactions, rashes  PHYSICAL EXAM: Filed Vitals:   04/25/15 1430  BP: 110/74  Pulse: 80  Resp: 16   General: No acute distress Head:  Normocephalic/atraumatic Eyes: Fundoscopic exam shows bilateral sharp discs, no vessel changes, exudates, or hemorrhages Neck: supple, no paraspinal tenderness, full range of motion Back: No paraspinal tenderness Heart: regular rate and rhythm Lungs: Clear to auscultation bilaterally. Vascular: No carotid bruits. Skin/Extremities: No rash, no edema Neurological Exam: Mental status: alert and oriented to person, place, and time, no dysarthria or aphasia, Fund of knowledge is appropriate.  Recent and remote memory are intact.  Attention and concentration are normal.    Able to name objects and repeat phrases. Cranial nerves: CN I: not tested CN II: pupils equal, round and reactive to light, visual fields intact, fundi unremarkable. CN III, IV, VI:  full range of motion, no nystagmus, no ptosis CN V: facial sensation intact CN VII: upper and  lower face symmetric CN VIII: hearing intact to finger rub CN IX, X: gag intact, uvula midline CN XI: sternocleidomastoid and trapezius muscles intact CN XII: tongue midline Bulk & Tone: normal, no fasciculations. Motor: 5/5 throughout with no pronator drift. Sensation: intact to light touch, cold, pin, vibration and joint position sense.  No extinction to double simultaneous stimulation.  Romberg test negative Deep Tendon Reflexes: +2 throughout, no ankle clonus Plantar responses: downgoing bilaterally Cerebellar: no incoordination on finger to nose, heel to shin. No dysdiadochokinesia Gait: narrow-based and steady, able to tandem walk adequately. Tremor: none  IMPRESSION: This is a pleasant 30 year old left-handed woman with a history of seizures since age 89 with primarily nocturnal seizures, however most recently she has been having seizures during wakefulness at work. Her neurological exam is non-focal. MRI brain reported as normal, however there appears to be some asymmetry of the gyri in the right lateral temporal/parietal region. A 1-hour sleep-deprived EEG will be ordered to further classify her seizures. She did not tolerate more therapeutic doses of Keppra, and is on low dose Keppra  in AM,  in PM. Would again do a re-trial of Lamotrigine  1 tab BID x 2 weeks, then increase to 2 tabs BID. She will continue taking Keppra for now as we uptitrate  Lamotrigine. Side effects of Lamotrigine, including Levonne Spiller syndrome, were discussed. [place] driving laws were discussed with the patient, and she knows to stop driving after a seizure, until 6 months seizure-free. She will follow-up in 1 month, at which point we will start tapering off Keppra and further increasing Lamotrigine. She knows to call for any problems in the interim.  Thank you for allowing me to participate in the care of this patient. Please do not hesitate to call for any questions or concerns.   Patrcia Dolly,  M.D.  CC: Dr. Caleb Popp

## 2015-04-25 NOTE — Patient Instructions (Signed)
1. Start Lamictal 25mg : Take 1 tablet twice a day for 2 weeks, then increase to 2 tablets twice a day and continue 2. Continue Keppra 500mg : Take 1/2 tablet in AM, 1 tablet in PM 3. Schedule 1-hour sleep-deprived EEG 4. As per [place] driving laws, no driving until 6 months seizure-free 5. Follow-up in 1 month  Seizure Precautions: 1. If medication has been prescribed for you to prevent seizures, take it exactly as directed.  Do not stop taking the medicine without talking to your doctor first, even if you have not had a seizure in a long time.   2. Avoid activities in which a seizure would cause danger to yourself or to others.  Don't operate dangerous machinery, swim alone, or climb in high or dangerous places, such as on ladders, roofs, or girders.  Do not drive unless your doctor says you may.  3. If you have any warning that you may have a seizure, lay down in a safe place where you can't hurt yourself.    4.  No driving for 6 months from last seizure, [place].   Please refer to the following link on the Epilepsy Foundation of America's website for more information: http://www.epilepsyfoundation.org/answerplace/Social/driving/drivingu.cfm   5.  Maintain good sleep hygiene. Avoid alcohol.  6.  Notify your neurology if you are planning pregnancy or if you become pregnant.  7.  Contact your doctor if you have any problems that may be related to the medicine you are taking.  8.  Call 911 and bring the patient back to the ED if:        A.  The seizure lasts longer than 5 minutes.       B.  The patient doesn't awaken shortly after the seizure  C.  The patient has new problems such as difficulty seeing, speaking or moving  D.  The patient was injured during the seizure  E.  The patient has a temperature over 102 F (39C)  F.  The patient vomited and now is having trouble breathing

## 2015-04-26 ENCOUNTER — Other Ambulatory Visit: Payer: Medicaid Other

## 2015-04-26 DIAGNOSIS — G40009 Localization-related (focal) (partial) idiopathic epilepsy and epileptic syndromes with seizures of localized onset, not intractable, without status epilepticus: Secondary | ICD-10-CM | POA: Insufficient documentation

## 2015-04-28 ENCOUNTER — Other Ambulatory Visit: Payer: Medicaid Other

## 2015-05-05 ENCOUNTER — Other Ambulatory Visit: Payer: Medicaid Other

## 2015-05-11 ENCOUNTER — Encounter: Payer: Self-pay | Admitting: Neurology

## 2015-05-11 ENCOUNTER — Ambulatory Visit (INDEPENDENT_AMBULATORY_CARE_PROVIDER_SITE_OTHER): Payer: Medicaid Other | Admitting: Neurology

## 2015-05-11 DIAGNOSIS — G40009 Localization-related (focal) (partial) idiopathic epilepsy and epileptic syndromes with seizures of localized onset, not intractable, without status epilepticus: Secondary | ICD-10-CM | POA: Diagnosis not present

## 2015-05-25 ENCOUNTER — Ambulatory Visit: Payer: Medicaid Other | Admitting: Neurology

## 2015-05-25 NOTE — Procedures (Signed)
ELECTROENCEPHALOGRAM REPORT  Date of Study: 05/11/2015  Patient's Name: Kelly Johns MRN: [id number] Date of Birth: Jan 25, 1986  Referring Provider: Dr. Patrcia DollyKaren Aquino  Clinical History: This is a 30 year old woman with primarily nocturnal seizures who had a seizure during wakefulness at work. EEG for classification.  Medications: Keppra, Lamictal  Technical Summary: A multichannel digital 1-hour sleep-deprived EEG recording measured by the international 10-20 system with electrodes applied with paste and impedances below 5000 ohms performed in our laboratory with EKG monitoring in an awake and asleep patient.  Hyperventilation and photic stimulation were performed.  The digital EEG was referentially recorded, reformatted, and digitally filtered in a variety of bipolar and referential montages for optimal display.    Description: The patient is awake and asleep during the recording.  During maximal wakefulness, there is a symmetric, medium voltage 10-10.5 Hz posterior dominant rhythm that attenuates with eye opening.  The record is symmetric.  During drowsiness and sleep, there is an increase in theta slowing of the background.  Vertex waves and symmetric sleep spindles were seen.  Hyperventilation and photic stimulation did not elicit any abnormalities.  There were no epileptiform discharges or electrographic seizures seen.    EKG lead was unremarkable.  Impression: This 1-hour awake and asleep EEG is normal.    Clinical Correlation: A normal EEG does not exclude a clinical diagnosis of epilepsy.  If further clinical questions remain, prolonged EEG may be helpful.  Clinical correlation is advised.   Patrcia DollyKaren Aquino, M.D.

## 2015-05-30 ENCOUNTER — Ambulatory Visit: Payer: Medicaid Other | Admitting: Neurology

## 2015-08-30 ENCOUNTER — Telehealth: Payer: Self-pay | Admitting: Neurology

## 2015-08-30 MED ORDER — LEVETIRACETAM 500 MG PO TABS: ORAL_TABLET | ORAL | Status: DC

## 2015-08-30 NOTE — Telephone Encounter (Signed)
She did not tolerate more therapeutic doses of Keppra, and is on low dose Keppra 250mg  in AM, 500mg  in PM. Would again do a re-trial of Lamotrigine 25mg  1 tab BID x 2 weeks, then increase to 2 tabs BID.

## 2015-08-30 NOTE — Telephone Encounter (Signed)
Kelly Johns 11/20/1985. She said she has had 2 seizures today. She said she does need a refill on her Levetiracetam. She is a patient of Dr. Karel JarvisAquino. She uses [facility] on [address] Her number is [phone]. Thank you

## 2015-10-03 ENCOUNTER — Ambulatory Visit: Payer: Medicaid Other | Admitting: Neurology

## 2015-10-07 ENCOUNTER — Encounter: Payer: Self-pay | Admitting: Neurology

## 2015-10-22 ENCOUNTER — Encounter (HOSPITAL_COMMUNITY): Payer: Self-pay | Admitting: Emergency Medicine

## 2015-10-22 ENCOUNTER — Emergency Department (HOSPITAL_COMMUNITY)
Admission: EM | Admit: 2015-10-22 | Discharge: 2015-10-22 | Disposition: A | Discharge status: Home / Self Care | Payer: Medicaid Other | Attending: Emergency Medicine | Admitting: Emergency Medicine

## 2015-10-22 DIAGNOSIS — F17210 Nicotine dependence, cigarettes, uncomplicated: Secondary | ICD-10-CM | POA: Insufficient documentation

## 2015-10-22 DIAGNOSIS — G40909 Epilepsy, unspecified, not intractable, without status epilepticus: Secondary | ICD-10-CM | POA: Insufficient documentation

## 2015-10-22 DIAGNOSIS — R569 Unspecified convulsions: Secondary | ICD-10-CM

## 2015-10-22 LAB — I-STAT BETA HCG BLOOD, ED (MC, WL, AP ONLY): I-stat hCG, quantitative: <5 m[IU]/mL (ref <5)

## 2015-10-22 MED ORDER — LAMOTRIGINE 25 MG PO TABS: ORAL_TABLET | ORAL | 0 refills | Status: DC

## 2015-10-22 MED ORDER — LORAZEPAM 2 MG/ML IJ SOLN
INTRAMUSCULAR | Status: AC
  Administered 2015-10-22: 2 mg
  Filled 2015-10-22: qty 1

## 2015-10-22 MED ORDER — LAMOTRIGINE 25 MG PO TABS: 25.0000 mg | ORAL_TABLET | Freq: Two times a day (BID) | ORAL | Status: DC

## 2015-10-22 MED ORDER — LEVETIRACETAM 500 MG PO TABS: 500.0000 mg | ORAL_TABLET | ORAL | Status: DC

## 2015-10-22 MED ORDER — LEVETIRACETAM 500 MG PO TABS: ORAL_TABLET | ORAL | 0 refills | Status: DC

## 2015-10-22 MED ORDER — LAMOTRIGINE 25 MG PO TABS
25.0000 mg | ORAL_TABLET | Freq: Once | ORAL | Status: AC
  Administered 2015-10-22: 25 mg via ORAL
  Filled 2015-10-22: qty 1

## 2015-10-22 MED ORDER — LAMOTRIGINE 25 MG PO TABS: 25.0000 mg | ORAL_TABLET | Freq: Two times a day (BID) | ORAL | 0 refills | Status: DC

## 2015-10-22 MED ORDER — LAMOTRIGINE 25 MG PO TABS
25.0000 mg | ORAL_TABLET | Freq: Two times a day (BID) | ORAL | Status: DC
  Filled 2015-10-22: qty 6

## 2015-10-22 MED ORDER — LEVETIRACETAM 500 MG PO TABS
500.0000 mg | ORAL_TABLET | ORAL | Status: DC
  Filled 2015-10-22 (×2): qty 5

## 2015-10-22 MED ORDER — SODIUM CHLORIDE 0.9 % IV SOLN
1000.0000 mg | Freq: Once | INTRAVENOUS | Status: AC
  Administered 2015-10-22: 1000 mg via INTRAVENOUS
  Filled 2015-10-22: qty 10

## 2015-10-22 NOTE — ED Provider Notes (Signed)
[facility]-EMERGENCY DEPT Provider Note   CSN: [id number] Arrival date & time: 10/22/15  1102     History   Chief Complaint Chief Complaint  Patient presents with  . Seizures    HPI Kelly Johns is a 30 y.o. female.  30yo F w/ PMH including seizure disorder who p/w seizures. PT reports that this morning she "came to" at home with disorientation, family members called EMS when she realized she had had a seizure during in her sleep. She had remained in bed and had no trauma/fall. Unknown how long seizure lasted. EMS was called and she refused transport. EMS was called a second time for a seizure lasting ~1 min and she was brought in. She endorses associated L temporal headache, generalized weakness, tongue biting, dizziness. She has had symptoms previously after seizures.  She has been out of medication for at least 1 month due to insurance issues w/ medication costs. No fevers or recent illness.   The history is provided by the patient.  Seizures      Past Medical History:  Diagnosis Date  . Seizures [facility])     Patient Active Problem List   Diagnosis Date Noted  . Localization-related (focal) (partial) idiopathic epilepsy and epileptic syndromes with seizures of localized onset, not intractable, without status epilepticus (HCC) 04/26/2015  . Throat pain   . Pharyngeal abscess 07/09/2014  . Parapharyngeal abscess 07/09/2014  . Trichomonas infection 11/18/2012  . BV (bacterial vaginosis) 11/18/2012  . Laceration of tongue without complication 02/29/2012  . Neck muscle spasm 09/07/2011  . Substance abuse 09/07/2011  . Seizure (HCC) 01/21/2011  . Annual physical exam 01/21/2011  . TOBACCO ABUSE 03/05/2007  . Obesity 04/18/2006    Past Surgical History:  Procedure Laterality Date  . c-sec x 2  2005, 2006    OB History    Gravida Para Term Preterm AB Living   2 2 2  0 0 2   SAB TAB Ectopic Multiple Live Births   0 0 0 0 2       Home Medications    Prior to  Admission medications   Medication Sig Start Date End Date Taking? Authorizing Provider  lamoTRIgine (LAMICTAL) 25 MG tablet Take 1 tablet by mouth (25mg ) twice daily for 2 weeks then increase to 2 tablets by mouth twice daily and continue 10/22/15   Laurence Spates, MD  lamoTRIgine (LAMICTAL) 25 MG tablet Take 1 tablet (25 mg total) by mouth 2 (two) times daily. 10/22/15   Laurence Spates, MD  levETIRAcetam (KEPPRA) 500 MG tablet Take 1/2 tablet (250mg ) by mouth in the afternoon at 1 tablet (500mg ) at night 10/22/15   Laurence Spates, MD  levETIRAcetam (KEPPRA) 500 MG tablet Take 1/2 tablet (250mg ) by mouth in the afternoon and 1 tablet (500mg ) by mouth in the evening 10/22/15   Laurence Spates, MD    Family History Family History  Problem Relation Age of Onset  . Diabetes Maternal Grandmother     Social History Social History  Substance Use Topics  . Smoking status: Current Every Day Smoker    Packs/day: 0.30    Types: Cigarettes  . Smokeless tobacco: Never Used     Comment: trying  . Alcohol use 0.0 oz/week     Comment: rarely     Allergies   Chocolate   Review of Systems Review of Systems  Neurological: Positive for seizures.   10 Systems reviewed and are negative for acute change except as noted in the  HPI.   Physical Exam Updated Vital Signs BP 120/67   Pulse 98   Temp 98.8 F (37.1 C) (Oral)   Resp 17   Ht 5\' 3"  (1.6 m)   Wt 209 lb (94.8 kg)   SpO2 97%   BMI 37.02 kg/m   Physical Exam  Constitutional: She is oriented to person, place, and time. She appears well-developed and well-nourished. No distress.  Sleepy but arousable  HENT:  Head: Normocephalic and atraumatic.  Eyes: Conjunctivae and EOM are normal. Pupils are equal, round, and reactive to light.  Neck: Neck supple.  Cardiovascular: Normal rate, regular rhythm and normal heart sounds.   No murmur heard. Pulmonary/Chest: Effort normal and breath sounds normal. No respiratory  distress.  Abdominal: Soft. Bowel sounds are normal. She exhibits no distension. There is no tenderness.  Musculoskeletal: She exhibits no edema.  Neurological: She is alert and oriented to person, place, and time. She has normal reflexes. No cranial nerve deficit. She exhibits normal muscle tone.  Sleepy but arousable, normal finger-to-nose testing, negative pronator drift, no clonus 5/5 strength and normal sensation x all 4 extremities  Skin: Skin is warm and dry.  Psychiatric: She has a normal mood and affect. Judgment and thought content normal.  Nursing note and vitals reviewed.    ED Treatments / Results  Labs (all labs ordered are listed, but only abnormal results are displayed) Labs Reviewed  I-STAT BETA HCG BLOOD, ED ([facility], [facility], [facility] ONLY)    EKG  EKG Interpretation None       Radiology No results found.  Procedures Procedures (including critical care time)  Medications Ordered in ED Medications  levETIRAcetam (KEPPRA) 1,000 mg in sodium chloride 0.9 % 100 mL IVPB (0 mg Intravenous Stopped 10/22/15 1230)  LORazepam (ATIVAN) 2 MG/ML injection (2 mg  Given 10/22/15 1220)  lamoTRIgine (LAMICTAL) tablet 25 mg (25 mg Oral Given 10/22/15 1549)     Initial Impression / Assessment and Plan / ED Course  I have reviewed the triage vital signs and the nursing notes.  Pertinent labs that were available during my care of the patient were reviewed by me and considered in my medical decision making (see chart for details).  Clinical Course   Patient with known seizure disorder, out of medications for the past one month, presents after 2 different seizures that occurred during her sleep which is typical for her. On initial exam she was sleepy but able to follow commands. Shortly after arrival, the patient had a brief generalized seizure for which we gave her Ativan. Keppra bolus had already started. She has had no infectious symptoms or recent head injury and I suspect that her seizure  activity is related to medication noncompliance. Observe the patient for 6 hours during which she had no further seizure activity. Gave her her home dose of Lamictal. I discussed with case management, who stated that because she is a Medicaid patient cannot provide her free medication but I did contact the inpatient pharmacy to provide her with 3 days of medication until she can contact her neurologist and primary care provider after the holiday weekend. I have given her her usual Lamictal and Keppra prescriptions as well. Return precautions reviewed. I also reviewed driving precautions including no driving until seizure free for 6 months. Patient voiced understanding and was discharged in satisfactory condition.  Final Clinical Impressions(s) / ED Diagnoses   Final diagnoses:  Seizures (HCC)    New Prescriptions New Prescriptions   LAMOTRIGINE (LAMICTAL) 25 MG  TABLET    Take 1 tablet by mouth (25mg ) twice daily for 2 weeks then increase to 2 tablets by mouth twice daily and continue   LAMOTRIGINE (LAMICTAL) 25 MG TABLET    Take 1 tablet (25 mg total) by mouth 2 (two) times daily.   LEVETIRACETAM (KEPPRA) 500 MG TABLET    Take 1/2 tablet (250mg ) by mouth in the afternoon at 1 tablet (500mg ) at night   LEVETIRACETAM (KEPPRA) 500 MG TABLET    Take 1/2 tablet (250mg ) by mouth in the afternoon and 1 tablet (500mg ) by mouth in the evening     Laurence Spates, MD 10/22/15 1700

## 2015-10-22 NOTE — ED Triage Notes (Signed)
Per EMS pt from home. 2nd call for EMS. Pt refused to be transported first time. Seizure lasted 1 minute. Pt has been without seizure medication for about 2 months. C/o headache and bite her tongue.Refused IV with EMS.

## 2015-10-22 NOTE — ED Notes (Signed)
Bed: [id number] Expected date: 10/22/15 Expected time: 11:02 AM Means of arrival:  Comments: Seizure

## 2015-10-22 NOTE — Discharge Instructions (Signed)
IT IS VERY IMPORTANT FOR YOU TO CONTACT YOUR PRIMARY CARE CLINIC AND NEUROLOGY CLINIC ON Tuesday MORNING TO GET YOUR MEDICATIONS OR DISCUSS OTHER MEDICINE OPTIONS FOR YOUR SEIZURE DISORDER.

## 2015-10-22 NOTE — Care Management Note (Signed)
Case Management Note  Patient Details  Name: Abelina Bachelorshley C Sthilaire MRN: [id number] Date of Birth: 03/27/1985  Subjective/Objective:  CM received call to assist the pt with medications. Pt has Medicaid (Birth Control) and has not been able to fill Keppra and Lamictal in months due to the cost being > $400.00. CM found GoodRx Coupons for both [facility] to use with RX. No additional CM needs.                   Action/Plan: Anticipate  Home with GoodRx  today. No further CM needs but will be available should additional discharge needs arise.   Expected Discharge Date:                  Expected Discharge Plan:  Home/Self Care  In-House Referral:  NA  Discharge planning Services  CM Consult, Medication Assistance  Post Acute Care Choice:  NA Choice offered to:  Patient  DME Arranged:  N/A DME Agency:  NA  HH Arranged:  NA HH Agency:  NA  Status of Service:  Completed, signed off  If discussed at Long Length of Stay Meetings, dates discussed:    Additional Comments:  Yvone NeuCrutchfield, Eliya Geiman M, RN 10/22/2015, 4:56 PM

## 2015-11-22 ENCOUNTER — Encounter (HOSPITAL_COMMUNITY): Payer: Self-pay | Admitting: Emergency Medicine

## 2015-11-22 ENCOUNTER — Emergency Department (HOSPITAL_COMMUNITY)
Admission: EM | Admit: 2015-11-22 | Discharge: 2015-11-22 | Disposition: A | Discharge status: Home / Self Care | Payer: Medicaid Other | Attending: Emergency Medicine | Admitting: Emergency Medicine

## 2015-11-22 DIAGNOSIS — Z79899 Other long term (current) drug therapy: Secondary | ICD-10-CM | POA: Insufficient documentation

## 2015-11-22 DIAGNOSIS — R569 Unspecified convulsions: Secondary | ICD-10-CM

## 2015-11-22 DIAGNOSIS — F17210 Nicotine dependence, cigarettes, uncomplicated: Secondary | ICD-10-CM | POA: Insufficient documentation

## 2015-11-22 DIAGNOSIS — Z9114 Patient's other noncompliance with medication regimen: Secondary | ICD-10-CM | POA: Insufficient documentation

## 2015-11-22 DIAGNOSIS — G40909 Epilepsy, unspecified, not intractable, without status epilepticus: Secondary | ICD-10-CM

## 2015-11-22 MED ORDER — LEVETIRACETAM 500 MG PO TABS: ORAL_TABLET | ORAL | 0 refills | Status: DC

## 2015-11-22 MED ORDER — LAMOTRIGINE 25 MG PO TABS
25.0000 mg | ORAL_TABLET | ORAL | Status: AC
  Administered 2015-11-22: 25 mg via ORAL
  Filled 2015-11-22: qty 1

## 2015-11-22 MED ORDER — LAMOTRIGINE 25 MG PO TABS: ORAL_TABLET | ORAL | 0 refills | Status: DC

## 2015-11-22 MED ORDER — LEVETIRACETAM 500 MG PO TABS
1000.0000 mg | ORAL_TABLET | Freq: Once | ORAL | Status: AC
  Administered 2015-11-22: 1000 mg via ORAL
  Filled 2015-11-22: qty 2

## 2015-11-22 NOTE — ED Triage Notes (Signed)
Per ems patient comes from home for witnessed seizure.  Patient has PMH seizures last seizure 10/22/15, patient doesn't have any Keppra.  Patient was incontienent and post ictal at scene per EMS.  Patient [id number] here and looking for phone and asking how long she has to be here due to her having school at 9am.

## 2015-11-22 NOTE — Discharge Instructions (Signed)
Read the information below.  Use the prescribed medication as directed.  Please discuss all new medications with your pharmacist.  You may return to the Emergency Department at any time for worsening condition or any new symptoms that concern you.     It is very important that you take your seizure medication.  This is for your own safety as well as the safety of those around you.  Do not drive until cleared by your neurologist or family practice physician.  We have printed out coupons to make your medications more affordable.  If you are unable to afford these medications, please speak with your doctors about changing to a more affordable medication.

## 2015-11-22 NOTE — ED Provider Notes (Signed)
[facility]-EMERGENCY DEPT Provider Note   CSN: [id number] Arrival date & time: 11/22/15  [id number]     History   Chief Complaint Chief Complaint  Patient presents with  . Seizures    HPI Kelly Johns is a 30 y.o. female.  HPI   Pt with hx seizures presents with recurrent seizure this morning while she was in bed. She does not remember it.  She states she has not taken any of her seizure medication x 2 weeks because she ran out, does not take the Keppra because it is >$200.  States she is feeling perfectly fine and needs to leave because she has school and has to take care of her kids, does not want to stay for any workup.  States she knows she had a seizure because she doesn't take her medications.  Denies fevers, recent illness, any pain anywhere, any injury.  Denies any confusion.      Past Medical History:  Diagnosis Date  . Seizures [facility](HCC)     Patient Active Problem List   Diagnosis Date Noted  . Localization-related (focal) (partial) idiopathic epilepsy and epileptic syndromes with seizures of localized onset, not intractable, without status epilepticus 04/26/2015  . Throat pain   . Pharyngeal abscess 07/09/2014  . Parapharyngeal abscess 07/09/2014  . Trichomonas infection 11/18/2012  . BV (bacterial vaginosis) 11/18/2012  . Laceration of tongue without complication 02/29/2012  . Neck muscle spasm 09/07/2011  . Substance abuse 09/07/2011  . Seizure (HCC) 01/21/2011  . Annual physical exam 01/21/2011  . TOBACCO ABUSE 03/05/2007  . Obesity 04/18/2006    Past Surgical History:  Procedure Laterality Date  . c-sec x 2  2005, 2006    OB History    Gravida Para Term Preterm AB Living   2 2 2  0 0 2   SAB TAB Ectopic Multiple Live Births   0 0 0 0 2       Home Medications    Prior to Admission medications   Medication Sig Start Date End Date Taking? Authorizing Provider  lamoTRIgine (LAMICTAL) 25 MG tablet Take 1 tablet (25 mg total) by mouth 2 (two) times daily.  10/22/15   Laurence Spatesachel Morgan Little, MD  lamoTRIgine (LAMICTAL) 25 MG tablet Take 1 tablet by mouth (25mg ) twice daily for 2 weeks then increase to 2 tablets by mouth twice daily and continue 11/22/15   Trixie DredgeEmily Roshawn Lacina, PA-C  levETIRAcetam (KEPPRA) 500 MG tablet Take 1/2 tablet (250mg ) by mouth in the afternoon and 1 tablet (500mg ) by mouth in the evening 10/22/15   Laurence Spatesachel Morgan Little, MD  levETIRAcetam (KEPPRA) 500 MG tablet Take 1/2 tablet (250mg ) by mouth in the afternoon at 1 tablet (500mg ) at night 11/22/15   Trixie DredgeEmily Otha Rickles, PA-C    Family History Family History  Problem Relation Age of Onset  . Diabetes Maternal Grandmother     Social History Social History  Substance Use Topics  . Smoking status: Current Every Day Smoker    Packs/day: 0.30    Types: Cigarettes  . Smokeless tobacco: Never Used     Comment: trying  . Alcohol use 0.0 oz/week     Comment: rarely     Allergies   Chocolate   Review of Systems Review of Systems  All other systems reviewed and are negative.    Physical Exam Updated Vital Signs BP 127/74 (BP Location: Left Arm)   Pulse 84   Temp 97.8 F (36.6 C) (Oral)   Ht 5\' 3"  (1.6 m)  Wt 94.8 kg   LMP 11/20/2015   SpO2 100%   BMI 37.02 kg/m   Physical Exam  Constitutional: She is oriented to person, place, and time. She appears well-developed and well-nourished. No distress.  HENT:  Head: Normocephalic and atraumatic.  Neck: Neck supple.  Cardiovascular: Normal rate and regular rhythm.   Pulmonary/Chest: Effort normal and breath sounds normal. No respiratory distress. She has no wheezes. She has no rales.  Abdominal: Soft. She exhibits no distension. There is no tenderness. There is no rebound and no guarding.  Neurological: She is alert and oriented to person, place, and time. GCS eye subscore is 4. GCS verbal subscore is 5. GCS motor subscore is 6.  Skin: She is not diaphoretic.  Psychiatric: Her speech is normal. Her mood appears anxious. She is  attentive.  Nursing note and vitals reviewed.    ED Treatments / Results  Labs (all labs ordered are listed, but only abnormal results are displayed) Labs Reviewed - No data to display  EKG  EKG Interpretation None       Radiology No results found.  Procedures Procedures (including critical care time)  Medications Ordered in ED Medications  levETIRAcetam (KEPPRA) tablet 1,000 mg (1,000 mg Oral Given 11/22/15 0916)  lamoTRIgine (LAMICTAL) tablet 25 mg (25 mg Oral Given 11/22/15 0916)     Initial Impression / Assessment and Plan / ED Course  I have reviewed the triage vital signs and the nursing notes.  Pertinent labs & imaging results that were available during my care of the patient were reviewed by me and considered in my medical decision making (see chart for details).  Clinical Course    Afebrile, nontoxic patient with witnessed seizure.  Pt no longer post-ictal.  Denies any ill-symptoms or injury.  She does not take any seizure medication due to cost.  Dose of home meds given here.  Pt advised to discuss medications with PCP or neurology.  Coupons given for home medications with new prescriptions.    D/C home.  Discussed result, findings, treatment, and follow up  with patient.  Pt given return precautions.  Pt verbalizes understanding and agrees with plan.       Final Clinical Impressions(s) / ED Diagnoses   Final diagnoses:  Seizure (HCC)  Seizure disorder (HCC)  Noncompliance with medications    New Prescriptions Discharge Medication List as of 11/22/2015 10:04 AM       Trixie Dredge, PA-C 11/22/15 1505    Cathren Laine, MD 11/22/15 [phone]

## 2015-11-22 NOTE — ED Notes (Signed)
Bed: ZO10WA14 Expected date:  Expected time:  Means of arrival:  Comments: EMS- 29yo F, seizure

## 2015-12-13 ENCOUNTER — Encounter (HOSPITAL_COMMUNITY): Payer: Self-pay | Admitting: Emergency Medicine

## 2015-12-13 ENCOUNTER — Emergency Department (HOSPITAL_COMMUNITY)
Admission: EM | Admit: 2015-12-13 | Discharge: 2015-12-13 | Disposition: A | Discharge status: Home / Self Care | Payer: Medicaid Other | Attending: Emergency Medicine | Admitting: Emergency Medicine

## 2015-12-13 DIAGNOSIS — G40909 Epilepsy, unspecified, not intractable, without status epilepticus: Secondary | ICD-10-CM | POA: Insufficient documentation

## 2015-12-13 DIAGNOSIS — F17210 Nicotine dependence, cigarettes, uncomplicated: Secondary | ICD-10-CM | POA: Insufficient documentation

## 2015-12-13 DIAGNOSIS — R569 Unspecified convulsions: Secondary | ICD-10-CM

## 2015-12-13 DIAGNOSIS — Z79899 Other long term (current) drug therapy: Secondary | ICD-10-CM | POA: Insufficient documentation

## 2015-12-13 MED ORDER — LAMOTRIGINE 25 MG PO TABS
25.0000 mg | ORAL_TABLET | Freq: Once | ORAL | Status: AC
  Administered 2015-12-13: 25 mg via ORAL
  Filled 2015-12-13: qty 1

## 2015-12-13 MED ORDER — LEVETIRACETAM 500 MG PO TABS
1000.0000 mg | ORAL_TABLET | Freq: Once | ORAL | Status: AC
  Administered 2015-12-13: 1000 mg via ORAL
  Filled 2015-12-13: qty 2

## 2015-12-13 MED ORDER — LEVETIRACETAM 500 MG PO TABS
ORAL_TABLET | ORAL | 0 refills | Status: DC
Start: 2015-12-13 — End: 2015-12-22

## 2015-12-13 NOTE — ED Notes (Signed)
Patient states been out of medication for 2 days due to medicaid being expired.

## 2015-12-13 NOTE — ED Provider Notes (Signed)
[facility] DEPT Provider Note   CSN: [id number] Arrival date & time: 12/13/15  0902     History   Chief Complaint Chief Complaint  Patient presents with  . Seizures    HPI Kelly Johns is a 30 y.o. female.  The history is provided by the patient.  Seizures   This is a recurrent problem. The current episode started less than 1 hour ago. The problem has been resolved. There were 2 to 3 seizures. Associated symptoms include sleepiness and cough. Pertinent negatives include no visual disturbance, no sore throat, no chest pain and no vomiting. Characteristics include loss of consciousness and bit tongue. The episode was witnessed. The seizures did not continue in the ED. The seizure(s) had no focality. There has been no fever.  complains of biting her tongue. Endorses recent rhinorrhea and nonproductive cough. No fever, chills, myalgia.   She has been off of her Keppra and Lamictal for 1 week due to financial constraints. She was seen here for the same on 10/3 and given coupon for Keppra.  Past Medical History:  Diagnosis Date  . Seizures [facility])     Patient Active Problem List   Diagnosis Date Noted  . Localization-related (focal) (partial) idiopathic epilepsy and epileptic syndromes with seizures of localized onset, not intractable, without status epilepticus 04/26/2015  . Throat pain   . Pharyngeal abscess 07/09/2014  . Parapharyngeal abscess 07/09/2014  . Trichomonas infection 11/18/2012  . BV (bacterial vaginosis) 11/18/2012  . Laceration of tongue without complication 02/29/2012  . Neck muscle spasm 09/07/2011  . Substance abuse 09/07/2011  . Seizure (HCC) 01/21/2011  . Annual physical exam 01/21/2011  . TOBACCO ABUSE 03/05/2007  . Obesity 04/18/2006    Past Surgical History:  Procedure Laterality Date  . c-sec x 2  2005, 2006    OB History    Gravida Para Term Preterm AB Living   2 2 2  0 0 2   SAB TAB Ectopic Multiple Live Births   0 0 0 0 2        Home Medications    Prior to Admission medications   Medication Sig Start Date End Date Taking? Authorizing Provider  lamoTRIgine (LAMICTAL) 25 MG tablet Take 1 tablet by mouth (25mg ) twice daily for 2 weeks then increase to 2 tablets by mouth twice daily and continue 11/22/15  Yes Trixie Dredge, PA-C  lamoTRIgine (LAMICTAL) 25 MG tablet Take 1 tablet (25 mg total) by mouth 2 (two) times daily. Patient not taking: Reported on 12/13/2015 10/22/15   Laurence Spates, MD  levETIRAcetam (KEPPRA) 500 MG tablet Take 1/2 tablet (250mg ) by mouth in the afternoon at 1 tablet (500mg ) at night 12/13/15   Nira Conn, MD    Family History Family History  Problem Relation Age of Onset  . Diabetes Maternal Grandmother     Social History Social History  Substance Use Topics  . Smoking status: Current Every Day Smoker    Packs/day: 0.30    Types: Cigarettes  . Smokeless tobacco: Never Used     Comment: trying  . Alcohol use 0.0 oz/week     Comment: rarely     Allergies   Chocolate   Review of Systems Review of Systems  Constitutional: Negative for chills and fever.  HENT: Positive for congestion and rhinorrhea. Negative for ear pain and sore throat.   Eyes: Negative for pain and visual disturbance.  Respiratory: Positive for cough. Negative for shortness of breath.   Cardiovascular: Negative for chest  pain and palpitations.  Gastrointestinal: Negative for abdominal pain and vomiting.  Genitourinary: Negative for dysuria and hematuria.  Musculoskeletal: Negative for arthralgias and back pain.  Skin: Negative for color change and rash.  Neurological: Positive for seizures and loss of consciousness. Negative for syncope.  All other systems reviewed and are negative.    Physical Exam Updated Vital Signs BP 123/65   Pulse 86   Temp 98.9 F (37.2 C)   Resp 24   LMP 11/20/2015   SpO2 100%   Physical Exam  Constitutional: She is oriented to person, place, and time.  She appears well-developed and well-nourished. No distress.  HENT:  Head: Normocephalic and atraumatic.  Nose: Nose normal.  Mouth/Throat: Oropharynx is clear and moist.  Mild swelling of the left side of tongue w/o laceration  Eyes: Conjunctivae and EOM are normal. Pupils are equal, round, and reactive to light. Right eye exhibits no discharge. Left eye exhibits no discharge. No scleral icterus.  Neck: Normal range of motion. Neck supple.  Cardiovascular: Normal rate and regular rhythm.  Exam reveals no gallop and no friction rub.   No murmur heard. Pulmonary/Chest: Effort normal and breath sounds normal. No stridor. No respiratory distress. She has no rales.  Abdominal: Soft. She exhibits no distension. There is no tenderness.  Musculoskeletal: She exhibits no edema or tenderness.  Neurological: She is alert and oriented to person, place, and time.  Mental Status: Alert and oriented to person, place, and time. Attention and concentration normal. Speech clear. Recent memory is intac  Cranial Nerves  II Visual Fields: Intact to confrontation. Visual fields intact. III, IV, VI: Pupils equal and reactive to light and near. Full eye movement without nystagmus  V Facial Sensation: Normal. No weakness of masticatory muscles  VII: No facial weakness or asymmetry  VIII Auditory Acuity: Grossly normal  IX/X: The uvula is midline; the palate elevates symmetrically  XI: Normal sternocleidomastoid and trapezius strength  XII: The tongue is midline. No atrophy or fasciculations.   Motor System: Muscle Strength: 5/5 and symmetric in the upper and lower extremities. No pronation or drift.  Muscle Tone: Tone and muscle bulk are normal in the upper and lower extremities.   Reflexes: DTRs: 2+ and symmetrical in all four extremities. Plantar responses are flexor bilaterally.  Coordination: Intact finger-to-nose, heel-to-shin, and rapid alternating movements. No tremor.  Sensation: Intact to light touch,  and pinprick. Negative Romberg test.  Gait: Routine and tandem gait are normal    Skin: Skin is warm and dry. No rash noted. She is not diaphoretic. No erythema.  Psychiatric: She has a normal mood and affect.  Vitals reviewed.    ED Treatments / Results  Labs (all labs ordered are listed, but only abnormal results are displayed) Labs Reviewed - No data to display  EKG  EKG Interpretation None       Radiology No results found.  Procedures Procedures (including critical care time)  Medications Ordered in ED Medications  levETIRAcetam (KEPPRA) tablet 1,000 mg (1,000 mg Oral Given 12/13/15 0941)  lamoTRIgine (LAMICTAL) tablet 25 mg (25 mg Oral Given 12/13/15 0941)     Initial Impression / Assessment and Plan / ED Course  I have reviewed the triage vital signs and the nursing notes.  Pertinent labs & imaging results that were available during my care of the patient were reviewed by me and considered in my medical decision making (see chart for details).  Clinical Course    Patient is afebrile, well-appearing well-hydrated. Exam  nonfocal. Secondary to medication noncompliance. Patient returned to baseline. Loaded with Keppra and Lamictal. Provider with coupons in Her prescription.  The patient is safe for discharge with strict return precautions.   Final Clinical Impressions(s) / ED Diagnoses   Final diagnoses:  Seizure (HCC)   Disposition: Discharge  Condition: Good  I have discussed the results, Dx and Tx plan with the patient who expressed understanding and agree(s) with the plan. Discharge instructions discussed at great length. The patient was given strict return precautions who verbalized understanding of the instructions. No further questions at time of discharge.    Discharge Medication List as of 12/13/2015 12:51 PM      Follow Up: Versie Starks, MD [address] [place] [address] [place] [address] [phone]  Schedule an appointment as soon  as possible for a visit  for continued management of your epilepsy      Nira Conn, MD 12/13/15 1336

## 2015-12-13 NOTE — ED Triage Notes (Signed)
Patient arrives [facility] for seizure activity. Started at 5:00am refused transport initially patient was post ictal on first call. Roommate called EMS second time for seizures. Patient alert and oriented x4 on arrival. Patient refused IV in route with EMS and any medical treatment by EMS. Patient on keppra with unknown last administration. Hx of seizures.

## 2015-12-13 NOTE — ED Notes (Signed)
Bed: WA09 Expected date:  Expected time:  Means of arrival:  Comments: Seizure 

## 2015-12-22 ENCOUNTER — Encounter (HOSPITAL_COMMUNITY): Payer: Self-pay

## 2015-12-22 ENCOUNTER — Emergency Department (HOSPITAL_COMMUNITY)
Admission: EM | Admit: 2015-12-22 | Discharge: 2015-12-22 | Disposition: A | Discharge status: Home / Self Care | Payer: Medicaid Other | Attending: Emergency Medicine | Admitting: Emergency Medicine

## 2015-12-22 DIAGNOSIS — G40909 Epilepsy, unspecified, not intractable, without status epilepticus: Secondary | ICD-10-CM | POA: Insufficient documentation

## 2015-12-22 DIAGNOSIS — F17210 Nicotine dependence, cigarettes, uncomplicated: Secondary | ICD-10-CM | POA: Insufficient documentation

## 2015-12-22 LAB — CBC WITH DIFFERENTIAL/PLATELET
Basophils Absolute: 0 10*3/uL (ref 0.0–0.1)
Basophils Relative: 0 %
Eosinophils Absolute: 0 10*3/uL (ref 0.0–0.7)
Eosinophils Relative: 0 %
HEMATOCRIT: 31.3 % — AB (ref 36.0–46.0)
Hemoglobin: 10.3 g/dL — ABNORMAL LOW (ref 12.0–15.0)
LYMPHS PCT: 38 %
Lymphs Abs: 2.3 10*3/uL (ref 0.7–4.0)
MCH: 28.6 pg (ref 26.0–34.0)
MCHC: 32.9 g/dL (ref 30.0–36.0)
MCV: 86.9 fL (ref 78.0–100.0)
MONO ABS: 0.5 10*3/uL (ref 0.1–1.0)
MONOS PCT: 8 %
NEUTROS ABS: 3.2 10*3/uL (ref 1.7–7.7)
Neutrophils Relative %: 54 %
Platelets: 249 10*3/uL (ref 150–400)
RBC: 3.6 MIL/uL — ABNORMAL LOW (ref 3.87–5.11)
RDW: 16.6 % — AB (ref 11.5–15.5)
WBC: 5.9 10*3/uL (ref 4.0–10.5)

## 2015-12-22 LAB — CBG MONITORING, ED: Glucose-Capillary: 94 mg/dL (ref 65–99)

## 2015-12-22 LAB — I-STAT CHEM 8, ED
BUN: 4 mg/dL — ABNORMAL LOW (ref 6–20)
CREATININE: 0.9 mg/dL (ref 0.44–1.00)
Calcium, Ion: 1.15 mmol/L (ref 1.15–1.40)
Chloride: 104 mmol/L (ref 101–111)
GLUCOSE: 96 mg/dL (ref 65–99)
HCT: 34 % — ABNORMAL LOW (ref 36.0–46.0)
HEMOGLOBIN: 11.6 g/dL — AB (ref 12.0–15.0)
POTASSIUM: 4 mmol/L (ref 3.5–5.1)
Sodium: 140 mmol/L (ref 135–145)
TCO2: 25 mmol/L (ref 0–100)

## 2015-12-22 LAB — I-STAT BETA HCG BLOOD, ED (MC, WL, AP ONLY)

## 2015-12-22 MED ORDER — ACETAMINOPHEN 325 MG PO TABS
650.0000 mg | ORAL_TABLET | Freq: Once | ORAL | Status: AC
  Administered 2015-12-22: 650 mg via ORAL
  Filled 2015-12-22: qty 2

## 2015-12-22 MED ORDER — SODIUM CHLORIDE 0.9 % IV SOLN
1000.0000 mg | Freq: Once | INTRAVENOUS | Status: AC
  Administered 2015-12-22: 1000 mg via INTRAVENOUS
  Filled 2015-12-22: qty 10

## 2015-12-22 MED ORDER — LEVETIRACETAM 500 MG PO TABS: 500.0000 mg | ORAL_TABLET | Freq: Two times a day (BID) | ORAL | 0 refills | Status: DC

## 2015-12-22 NOTE — ED Provider Notes (Signed)
[facility]-EMERGENCY DEPT Provider Note   CSN: [id number] Arrival date & time: 12/22/15  0404     History   Chief Complaint Chief Complaint  Patient presents with  . Seizures    HPI Kelly Johns is a 30 y.o. female.  The history is provided by the patient and the EMS personnel.  Seizures   This is a recurrent problem. The current episode started less than 1 hour ago. The problem has not changed since onset.There was 1 seizure. Pertinent negatives include no sleepiness, no headaches, no speech difficulty, no neck stiffness, no chest pain and no cough. Characteristics include rhythmic jerking. Characteristics do not include eye blinking. The episode was witnessed. There was no sensation of an aura present. The seizures did not continue in the ED. The seizure(s) had no focality. Possible causes include missed seizure meds. There has been no fever. There were no medications administered prior to arrival.    Past Medical History:  Diagnosis Date  . Seizures [facility])     Patient Active Problem List   Diagnosis Date Noted  . Localization-related (focal) (partial) idiopathic epilepsy and epileptic syndromes with seizures of localized onset, not intractable, without status epilepticus 04/26/2015  . Throat pain   . Pharyngeal abscess 07/09/2014  . Parapharyngeal abscess 07/09/2014  . Trichomonas infection 11/18/2012  . BV (bacterial vaginosis) 11/18/2012  . Laceration of tongue without complication 02/29/2012  . Neck muscle spasm 09/07/2011  . Substance abuse 09/07/2011  . Seizure (HCC) 01/21/2011  . Annual physical exam 01/21/2011  . TOBACCO ABUSE 03/05/2007  . Obesity 04/18/2006    Past Surgical History:  Procedure Laterality Date  . c-sec x 2  2005, 2006    OB History    Gravida Para Term Preterm AB Living   2 2 2  0 0 2   SAB TAB Ectopic Multiple Live Births   0 0 0 0 2       Home Medications    Prior to Admission medications   Medication Sig Start Date End Date  Taking? Authorizing Provider  lamoTRIgine (LAMICTAL) 25 MG tablet Take 1 tablet (25 mg total) by mouth 2 (two) times daily. Patient not taking: Reported on 12/13/2015 10/22/15   Laurence Spates, MD  lamoTRIgine (LAMICTAL) 25 MG tablet Take 1 tablet by mouth (25mg ) twice daily for 2 weeks then increase to 2 tablets by mouth twice daily and continue 11/22/15   Trixie Dredge, PA-C  levETIRAcetam (KEPPRA) 500 MG tablet Take 1/2 tablet (250mg ) by mouth in the afternoon at 1 tablet (500mg ) at night 12/13/15   Nira Conn, MD    Family History Family History  Problem Relation Age of Onset  . Diabetes Maternal Grandmother     Social History Social History  Substance Use Topics  . Smoking status: Current Every Day Smoker    Packs/day: 0.30    Types: Cigarettes  . Smokeless tobacco: Never Used     Comment: trying  . Alcohol use No     Comment: rarely     Allergies   Chocolate   Review of Systems Review of Systems  Constitutional: Negative for fever.  Respiratory: Negative for cough.   Cardiovascular: Negative for chest pain.  Musculoskeletal: Negative for neck pain and neck stiffness.  Skin: Negative for rash.  Neurological: Positive for seizures. Negative for facial asymmetry, speech difficulty and headaches.  All other systems reviewed and are negative.    Physical Exam Updated Vital Signs BP 143/84 (BP Location: Right Arm)  Pulse 108   Resp 17   LMP 12/18/2015 (Approximate)   SpO2 100%   Physical Exam  Constitutional: She is oriented to person, place, and time. She appears well-developed and well-nourished. No distress.  HENT:  Head: Normocephalic and atraumatic.  Mouth/Throat: No oropharyngeal exudate.  Eyes: EOM are normal. Pupils are equal, round, and reactive to light.  Neck: Normal range of motion. Neck supple. No JVD present.  Cardiovascular: Normal rate, regular rhythm and intact distal pulses.   Pulmonary/Chest: Effort normal and breath sounds  normal. No stridor. No respiratory distress. She has no wheezes. She has no rales.  Abdominal: Soft. Bowel sounds are normal. She exhibits no mass. There is no tenderness. There is no rebound and no guarding.  Musculoskeletal: Normal range of motion.  Neurological: She is alert and oriented to person, place, and time. She has normal reflexes.  Skin: Skin is warm and dry. Capillary refill takes less than 2 seconds.  Psychiatric: She has a normal mood and affect.     ED Treatments / Results   Vitals:   12/22/15 0414  BP: 143/84  Pulse: 108  Resp: 17   Results for orders placed or performed during the hospital encounter of 12/22/15  CBC with Differential/Platelet  Result Value Ref Range   WBC 5.9 4.0 - 10.5 K/uL   RBC 3.60 (L) 3.87 - 5.11 MIL/uL   Hemoglobin 10.3 (L) 12.0 - 15.0 g/dL   HCT 16.131.3 (L) 09.636.0 - 04.546.0 %   MCV 86.9 78.0 - 100.0 fL   MCH 28.6 26.0 - 34.0 pg   MCHC 32.9 30.0 - 36.0 g/dL   RDW 40.916.6 (H) 81.111.5 - 91.415.5 %   Platelets 249 150 - 400 K/uL   Neutrophils Relative % 54 %   Neutro Abs 3.2 1.7 - 7.7 K/uL   Lymphocytes Relative 38 %   Lymphs Abs 2.3 0.7 - 4.0 K/uL   Monocytes Relative 8 %   Monocytes Absolute 0.5 0.1 - 1.0 K/uL   Eosinophils Relative 0 %   Eosinophils Absolute 0.0 0.0 - 0.7 K/uL   Basophils Relative 0 %   Basophils Absolute 0.0 0.0 - 0.1 K/uL  CBG monitoring, ED  Result Value Ref Range   Glucose-Capillary 94 65 - 99 mg/dL  I-Stat beta hCG blood, ED  Result Value Ref Range   I-stat hCG, quantitative <5.0 <5 mIU/mL   Comment 3          I-Stat Chem 8, ED  Result Value Ref Range   Sodium 140 135 - 145 mmol/L   Potassium 4.0 3.5 - 5.1 mmol/L   Chloride 104 101 - 111 mmol/L   BUN 4 (L) 6 - 20 mg/dL   Creatinine, Ser 7.820.90 0.44 - 1.00 mg/dL   Glucose, Bld 96 65 - 99 mg/dL   Calcium, Ion 9.561.15 2.131.15 - 1.40 mmol/L   TCO2 25 0 - 100 mmol/L   Hemoglobin 11.6 (L) 12.0 - 15.0 g/dL   HCT 08.634.0 (L) 57.836.0 - 46.946.0 %   No results  found.  Procedures Procedures (including critical care time)  Medications Ordered in ED Medications  levETIRAcetam (KEPPRA) 1,000 mg in sodium chloride 0.9 % 100 mL IVPB (not administered)   Po challenged in the ED no further seizures Final Clinical Impressions(s) / ED Diagnoses  Seizures secondary to noncompliance.  Take your meds! Follow up with neurology no driving until cleared by neurology this was printed on your discharge papers.  All questions answered to patient's parents satisfaction. Based  on history and exam patient has been appropriately medically screened and emergency conditions excluded. Patient is stable for discharge at this time. Follow up with your PMD for recheck in 2 days and strict return precautions given.     Cy BlamerApril Jashanti Clinkscale, MD 12/22/15 [phone]

## 2015-12-22 NOTE — ED Notes (Signed)
Pt given sprite to drink. 

## 2015-12-22 NOTE — Progress Notes (Signed)
CSW spoke with patient at bedside with no family present. Patient was provided resources for homelessness: [place] transitional housing, women emergency shelters, boarding houses, [employer] [place] [facility] (homelessness prevention), [employer], green book (free meals), and blue book (food pantries). CSW informed patient ED CM would need to speak with her regarding medications. No questions noted for CSW at this time.  Kelly PaddyLaVonia Aviyah Johns, LCSWA [phone] ED CSW 12/22/2015 8:47 AM

## 2015-12-22 NOTE — ED Notes (Signed)
Pt states she has been staying with friends and they told her she could no longer stay there since she was having seizures  Pt states she has no where else to go and is not sure how she is going to be able to get her medications
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
## 2018-02-28 ENCOUNTER — Emergency Department (HOSPITAL_COMMUNITY)
Admission: EM | Admit: 2018-02-28 | Discharge: 2018-02-28 | Disposition: A | Discharge status: Home / Self Care | Payer: Self-pay | Attending: Emergency Medicine | Admitting: Emergency Medicine

## 2018-02-28 ENCOUNTER — Encounter (HOSPITAL_COMMUNITY): Payer: Self-pay | Admitting: Emergency Medicine

## 2018-02-28 ENCOUNTER — Other Ambulatory Visit: Payer: Self-pay

## 2018-02-28 ENCOUNTER — Emergency Department (HOSPITAL_COMMUNITY): Payer: Self-pay

## 2018-02-28 DIAGNOSIS — R569 Unspecified convulsions: Secondary | ICD-10-CM | POA: Insufficient documentation

## 2018-02-28 DIAGNOSIS — Z79899 Other long term (current) drug therapy: Secondary | ICD-10-CM | POA: Insufficient documentation

## 2018-02-28 DIAGNOSIS — F17210 Nicotine dependence, cigarettes, uncomplicated: Secondary | ICD-10-CM | POA: Insufficient documentation

## 2018-02-28 LAB — CBC WITH DIFFERENTIAL/PLATELET
ABS IMMATURE GRANULOCYTES: 0.03 10*3/uL (ref 0.00–0.07)
BASOS ABS: 0 10*3/uL (ref 0.0–0.1)
BASOS PCT: 0 %
EOS ABS: 0 10*3/uL (ref 0.0–0.5)
Eosinophils Relative: 0 %
HCT: 29.2 % — ABNORMAL LOW (ref 36.0–46.0)
Hemoglobin: 8.3 g/dL — ABNORMAL LOW (ref 12.0–15.0)
IMMATURE GRANULOCYTES: 0 %
Lymphocytes Relative: 24 %
Lymphs Abs: 1.9 10*3/uL (ref 0.7–4.0)
MCH: 22.5 pg — ABNORMAL LOW (ref 26.0–34.0)
MCHC: 28.4 g/dL — ABNORMAL LOW (ref 30.0–36.0)
MCV: 79.1 fL — ABNORMAL LOW (ref 80.0–100.0)
MONOS PCT: 6 %
Monocytes Absolute: 0.5 10*3/uL (ref 0.1–1.0)
NEUTROS ABS: 5.4 10*3/uL (ref 1.7–7.7)
NEUTROS PCT: 70 %
NRBC: 0 % (ref 0.0–0.2)
PLATELETS: 313 10*3/uL (ref 150–400)
RBC: 3.69 MIL/uL — ABNORMAL LOW (ref 3.87–5.11)
RDW: 18.6 % — AB (ref 11.5–15.5)
WBC: 7.8 10*3/uL (ref 4.0–10.5)

## 2018-02-28 LAB — COMPREHENSIVE METABOLIC PANEL
ALBUMIN: 4 g/dL (ref 3.5–5.0)
ALK PHOS: 67 U/L (ref 38–126)
ALT: 12 U/L (ref 0–44)
AST: 17 U/L (ref 15–41)
Anion gap: 9 (ref 5–15)
BUN: 10 mg/dL (ref 6–20)
CALCIUM: 8.7 mg/dL — AB (ref 8.9–10.3)
CO2: 23 mmol/L (ref 22–32)
Chloride: 105 mmol/L (ref 98–111)
Creatinine, Ser: 0.97 mg/dL (ref 0.44–1.00)
GFR calc Af Amer: >60 mL/min (ref >60)
GFR calc non Af Amer: >60 mL/min (ref >60)
GLUCOSE: 94 mg/dL (ref 70–99)
POTASSIUM: 3.7 mmol/L (ref 3.5–5.1)
Sodium: 137 mmol/L (ref 135–145)
TOTAL PROTEIN: 8.1 g/dL (ref 6.5–8.1)

## 2018-02-28 LAB — VALPROIC ACID LEVEL

## 2018-02-28 LAB — I-STAT BETA HCG BLOOD, ED (MC, WL, AP ONLY): I-stat hCG, quantitative: <5 m[IU]/mL (ref <5)

## 2018-02-28 LAB — CBG MONITORING, ED: Glucose-Capillary: 87 mg/dL (ref 70–99)

## 2018-02-28 MED ORDER — SODIUM CHLORIDE 0.9 % IV BOLUS
1000.0000 mL | Freq: Once | INTRAVENOUS | Status: AC
  Administered 2018-02-28: 1000 mL via INTRAVENOUS

## 2018-02-28 MED ORDER — LEVETIRACETAM IN NACL 1000 MG/100ML IV SOLN
1000.0000 mg | Freq: Once | INTRAVENOUS | Status: AC
  Administered 2018-02-28: 1000 mg via INTRAVENOUS
  Filled 2018-02-28: qty 100

## 2018-02-28 MED ORDER — LEVETIRACETAM 500 MG PO TABS: 500.0000 mg | ORAL_TABLET | Freq: Two times a day (BID) | ORAL | 0 refills | Status: DC
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
## 2018-02-28 NOTE — ED Notes (Addendum)
Patient noted to have 10 seconds of seizure like activity with no post-ictal phase noted. Patient A&O x4 and ambulatory directly after activity.
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
## 2018-02-28 NOTE — ED Notes (Signed)
Returned from x-ray/CT pending
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
## 2018-02-28 NOTE — ED Notes (Signed)
Per visitors in the lobby, we were told that this pt was having a seizure. Upon walking to the lobby, the pt was on the ground by the front door. She was incontinent of urine, bloody sputum drooling from her mouth, and postictal. Pt was brought back to resus room. Also, per visitors in the lobby, the pt slumped down; she did not hit her head on the floor.
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
## 2018-02-28 NOTE — ED Notes (Signed)
Transported to CT and xray ?

## 2018-02-28 NOTE — ED Notes (Signed)
To CT and returned

## 2018-02-28 NOTE — ED Notes (Addendum)
Pt states she is going to leave when she needs to catch the bus by 9:30pm. Pt advised to stay and receive the full dose of Keppra.

## 2018-02-28 NOTE — ED Notes (Signed)
Pt was having a seizure, found on lobby floor in recumbent position spitting up red sputum. Patient then began having agonal gasps and was noticeably unconscious. Pt taken to recess B via stretcher

## 2018-02-28 NOTE — ED Notes (Addendum)
Called lab to make sure orders are completed.

## 2018-02-28 NOTE — ED Notes (Signed)
Also drew light green, lavender, and a red top

## 2018-02-28 NOTE — ED Notes (Signed)
Pt agrees to discharge. Pt has no questions at this time. Pt is unable to sign due to topaz not being available.

## 2018-03-05 ENCOUNTER — Telehealth: Payer: Self-pay | Admitting: *Deleted

## 2018-03-05 NOTE — Telephone Encounter (Signed)
-----   Message from [name], MD sent at 03/05/2018 10:32 AM EST ----- Regarding: appointment Can we try to schedule an appointment for this patient? She has been off of her seizure medications and was recently in the ED for seizures.  She has no future appointments listed.  I tried calling her earlier but there was no answer.   Kelly Johns

## 2018-03-05 NOTE — Telephone Encounter (Signed)
LVM to call office back to inform pt of below and to schedule her an appointment.Kelly Johns, [name], [place]

## 2018-03-13 NOTE — Telephone Encounter (Signed)
LVM on mobile, VM not set up on home number.  LVM asking pt to call office back to see about getting her an appointment scheduled to follow up from her recent hospital visit.  Please assist her in getting this scheduled if she calls back. Kelly Johns, April D, [place]

## 2018-03-31 ENCOUNTER — Encounter: Payer: Self-pay | Admitting: *Deleted

## 2018-03-31 NOTE — Telephone Encounter (Signed)
LM with female asking pt to call office back to see about getting her an appointment scheduled.  Will also mail a letter instructing her to call us at her earliest convenience. Lamonte Sakai, Reade Trefz D

## 2019-04-05 ENCOUNTER — Emergency Department (HOSPITAL_COMMUNITY)
Admission: EM | Admit: 2019-04-05 | Discharge: 2019-04-06 | Disposition: A | Discharge status: Home / Self Care | Payer: Medicaid Other | Attending: Emergency Medicine | Admitting: Emergency Medicine

## 2019-04-05 ENCOUNTER — Other Ambulatory Visit: Payer: Self-pay

## 2019-04-05 ENCOUNTER — Encounter (HOSPITAL_COMMUNITY): Payer: Self-pay | Admitting: Emergency Medicine

## 2019-04-05 DIAGNOSIS — F19951 Other psychoactive substance use, unspecified with psychoactive substance-induced psychotic disorder with hallucinations: Secondary | ICD-10-CM | POA: Insufficient documentation

## 2019-04-05 DIAGNOSIS — R443 Hallucinations, unspecified: Secondary | ICD-10-CM

## 2019-04-05 DIAGNOSIS — Z79899 Other long term (current) drug therapy: Secondary | ICD-10-CM | POA: Insufficient documentation

## 2019-04-05 DIAGNOSIS — F17210 Nicotine dependence, cigarettes, uncomplicated: Secondary | ICD-10-CM | POA: Insufficient documentation

## 2019-04-05 DIAGNOSIS — R569 Unspecified convulsions: Secondary | ICD-10-CM

## 2019-04-05 DIAGNOSIS — Z20822 Contact with and (suspected) exposure to covid-19: Secondary | ICD-10-CM | POA: Insufficient documentation

## 2019-04-05 DIAGNOSIS — F1910 Other psychoactive substance abuse, uncomplicated: Secondary | ICD-10-CM | POA: Diagnosis present

## 2019-04-05 LAB — I-STAT BETA HCG BLOOD, ED (MC, WL, AP ONLY): I-stat hCG, quantitative: <5 m[IU]/mL (ref <5)

## 2019-04-05 LAB — COMPREHENSIVE METABOLIC PANEL
ALT: 17 U/L (ref 0–44)
AST: 22 U/L (ref 15–41)
Albumin: 3.9 g/dL (ref 3.5–5.0)
Alkaline Phosphatase: 67 U/L (ref 38–126)
Anion gap: 13 (ref 5–15)
BUN: <5 mg/dL — ABNORMAL LOW (ref 6–20)
CO2: 25 mmol/L (ref 22–32)
Calcium: 9.4 mg/dL (ref 8.9–10.3)
Chloride: 99 mmol/L (ref 98–111)
Creatinine, Ser: 0.98 mg/dL (ref 0.44–1.00)
GFR calc Af Amer: >60 mL/min (ref >60)
GFR calc non Af Amer: >60 mL/min (ref >60)
Glucose, Bld: 112 mg/dL — ABNORMAL HIGH (ref 70–99)
Potassium: 2.9 mmol/L — ABNORMAL LOW (ref 3.5–5.1)
Sodium: 137 mmol/L (ref 135–145)
Total Bilirubin: 0.6 mg/dL (ref 0.3–1.2)
Total Protein: 8 g/dL (ref 6.5–8.1)

## 2019-04-05 LAB — SARS CORONAVIRUS 2 (TAT 6-24 HRS): SARS Coronavirus 2: NEGATIVE

## 2019-04-05 LAB — URINALYSIS, ROUTINE W REFLEX MICROSCOPIC
Bacteria, UA: NONE SEEN
Bilirubin Urine: NEGATIVE
Glucose, UA: NEGATIVE mg/dL
Hgb urine dipstick: NEGATIVE
Ketones, ur: 20 mg/dL — AB
Leukocytes,Ua: NEGATIVE
Nitrite: NEGATIVE
Protein, ur: 100 mg/dL — AB
Specific Gravity, Urine: 1.027 (ref 1.005–1.030)
pH: 6 (ref 5.0–8.0)

## 2019-04-05 LAB — CBC
HCT: 30.8 % — ABNORMAL LOW (ref 36.0–46.0)
Hemoglobin: 9.3 g/dL — ABNORMAL LOW (ref 12.0–15.0)
MCH: 23.4 pg — ABNORMAL LOW (ref 26.0–34.0)
MCHC: 30.2 g/dL (ref 30.0–36.0)
MCV: 77.4 fL — ABNORMAL LOW (ref 80.0–100.0)
Platelets: 353 10*3/uL (ref 150–400)
RBC: 3.98 MIL/uL (ref 3.87–5.11)
RDW: 19.9 % — ABNORMAL HIGH (ref 11.5–15.5)
WBC: 6.4 10*3/uL (ref 4.0–10.5)
nRBC: 0 % (ref 0.0–0.2)

## 2019-04-05 LAB — SALICYLATE LEVEL: Salicylate Lvl: <7 mg/dL — ABNORMAL LOW (ref 7.0–30.0)

## 2019-04-05 LAB — ACETAMINOPHEN LEVEL: Acetaminophen (Tylenol), Serum: <10 ug/mL — ABNORMAL LOW (ref 10–30)

## 2019-04-05 LAB — RAPID URINE DRUG SCREEN, HOSP PERFORMED
Amphetamines: POSITIVE — AB
Barbiturates: NOT DETECTED
Benzodiazepines: NOT DETECTED
Cocaine: NOT DETECTED
Opiates: NOT DETECTED
Tetrahydrocannabinol: POSITIVE — AB

## 2019-04-05 LAB — MAGNESIUM: Magnesium: 1.8 mg/dL (ref 1.7–2.4)

## 2019-04-05 LAB — ETHANOL: Alcohol, Ethyl (B): <10 mg/dL (ref <10)

## 2019-04-05 MED ORDER — ONDANSETRON HCL 4 MG PO TABS: 4.0000 mg | ORAL_TABLET | Freq: Three times a day (TID) | ORAL | Status: DC | PRN

## 2019-04-05 MED ORDER — ALUM & MAG HYDROXIDE-SIMETH 200-200-20 MG/5ML PO SUSP: 30.0000 mL | Freq: Four times a day (QID) | ORAL | Status: DC | PRN

## 2019-04-05 MED ORDER — NICOTINE 21 MG/24HR TD PT24
21.0000 mg | MEDICATED_PATCH | Freq: Every day | TRANSDERMAL | Status: DC
  Filled 2019-04-05: qty 1

## 2019-04-05 MED ORDER — POTASSIUM CHLORIDE CRYS ER 20 MEQ PO TBCR
40.0000 meq | EXTENDED_RELEASE_TABLET | Freq: Once | ORAL | Status: AC
  Administered 2019-04-05: 40 meq via ORAL
  Filled 2019-04-05: qty 2

## 2019-04-05 MED ORDER — POTASSIUM CHLORIDE CRYS ER 20 MEQ PO TBCR: 20.0000 meq | EXTENDED_RELEASE_TABLET | Freq: Two times a day (BID) | ORAL | Status: DC

## 2019-04-05 MED ORDER — LEVETIRACETAM 500 MG PO TABS
500.0000 mg | ORAL_TABLET | Freq: Once | ORAL | Status: AC
  Administered 2019-04-05: 16:00:00 500 mg via ORAL
  Filled 2019-04-05: qty 1

## 2019-04-05 MED ORDER — LAMOTRIGINE 25 MG PO TABS
25.0000 mg | ORAL_TABLET | Freq: Two times a day (BID) | ORAL | Status: DC
  Filled 2019-04-05 (×2): qty 1

## 2019-04-05 MED ORDER — LEVETIRACETAM 500 MG PO TABS: 500.0000 mg | ORAL_TABLET | Freq: Two times a day (BID) | ORAL | Status: DC

## 2019-04-05 MED ORDER — ACETAMINOPHEN 325 MG PO TABS: 650.0000 mg | ORAL_TABLET | ORAL | Status: DC | PRN

## 2019-04-05 NOTE — ED Provider Notes (Signed)
[facility] EMERGENCY DEPARTMENT Provider Note   CSN: [id number] Arrival date & time: 04/05/19  1314     History Chief Complaint  Patient presents with  . Hallucinations    Kelly Johns is a 34 y.o. female.  HPI 34 year old female presents with seizures and hallucinations.  Patient overall reports she had a seizure a day or 2 ago.  She is not compliant with her seizure meds.  Last took a couple weeks ago.  She also noted some arm pain today that that is gone.  It was in the left arm and she thinks is from having a seizure the other day.  Not painful now.  Mom accompanies patient and notes that the patient was talking about hallucinations and seeing God and that the rapture was coming.  Patient denies smoking, alcohol use.  She does endorse taking a pill when asked about drug use, and states it was a couple days ago and this often lets her get closer to God.  Things often seem to be moving fast, faster than they should be.  Past Medical History:  Diagnosis Date  . Seizures [facility])     Patient Active Problem List   Diagnosis Date Noted  . Localization-related (focal) (partial) idiopathic epilepsy and epileptic syndromes with seizures of localized onset, not intractable, without status epilepticus (HCC) 04/26/2015  . Throat pain   . Pharyngeal abscess 07/09/2014  . Parapharyngeal abscess 07/09/2014  . Trichomonas infection 11/18/2012  . BV (bacterial vaginosis) 11/18/2012  . Laceration of tongue without complication 02/29/2012  . Neck muscle spasm 09/07/2011  . Substance abuse (HCC) 09/07/2011  . Seizure (HCC) 01/21/2011  . Annual physical exam 01/21/2011  . TOBACCO ABUSE 03/05/2007  . Obesity 04/18/2006    Past Surgical History:  Procedure Laterality Date  . c-sec x 2  2005, 2006     OB History    Gravida  2   Para  2   Term  2   Preterm  0   AB  0   Living  2     SAB  0   TAB  0   Ectopic  0   Multiple  0   Live Births  2             Family History  Problem Relation Age of Onset  . Diabetes Maternal Grandmother     Social History   Tobacco Use  . Smoking status: Current Every Day Smoker    Packs/day: 0.30    Types: Cigarettes  . Smokeless tobacco: Never Used  . Tobacco comment: trying  Substance Use Topics  . Alcohol use: Yes  . Drug use: Yes    Frequency: 7.0 times per week    Types: Marijuana    Comment: daily    Home Medications Prior to Admission medications   Medication Sig Start Date End Date Taking? Authorizing Provider  lamoTRIgine (LAMICTAL) 25 MG tablet Take 1 tablet (25 mg total) by mouth 2 (two) times daily. Patient not taking: Reported on 12/22/2015 10/22/15   Little, Ambrose Finland, MD  lamoTRIgine (LAMICTAL) 25 MG tablet Take 1 tablet by mouth (25mg ) twice daily for 2 weeks then increase to 2 tablets by mouth twice daily and continue 11/22/15   01/22/16, Emily, PA-C  levETIRAcetam (KEPPRA) 500 MG tablet Take 1 tablet (500 mg total) by mouth 2 (two) times daily. 12/22/15   Palumbo, April, MD  levETIRAcetam (KEPPRA) 500 MG tablet Take 1 tablet (500 mg total)  by mouth 2 (two) times daily. 02/28/18   Tegeler, Gwenyth Allegra, MD    Allergies    Chocolate  Review of Systems   Review of Systems  Constitutional: Positive for fever.  Gastrointestinal: Negative for diarrhea and vomiting.  Neurological: Positive for seizures. Negative for headaches.  Psychiatric/Behavioral: Positive for hallucinations. Negative for suicidal ideas.  All other systems reviewed and are negative.   Physical Exam Updated Vital Signs BP (!) 149/84   Pulse 91   Temp 98.8 F (37.1 C) (Oral)   Resp 17   LMP 03/29/2019   SpO2 100%   Physical Exam Vitals and nursing note reviewed.  Constitutional:      Appearance: She is well-developed.  HENT:     Head: Normocephalic and atraumatic.     Right Ear: External ear normal.     Left Ear: External ear normal.     Nose: Nose normal.  Eyes:     General:        Right  eye: No discharge.        Left eye: No discharge.     Extraocular Movements: Extraocular movements intact.     Pupils: Pupils are equal, round, and reactive to light.  Cardiovascular:     Rate and Rhythm: Normal rate and regular rhythm.     Heart sounds: Normal heart sounds.  Pulmonary:     Effort: Pulmonary effort is normal.     Breath sounds: Normal breath sounds.  Abdominal:     General: There is no distension.     Palpations: Abdomen is soft.     Tenderness: There is no abdominal tenderness.  Musculoskeletal:     Comments: No tenderness, swelling or pain in LUE.  Skin:    General: Skin is warm and dry.  Neurological:     Mental Status: She is alert and oriented to person, place, and time.     Comments: CN 3-12 grossly intact. 5/5 strength in all 4 extremities. Grossly normal sensation. Normal finger to nose.   Psychiatric:        Mood and Affect: Mood is not anxious.        Thought Content: Thought content does not include suicidal ideation.     ED Results / Procedures / Treatments   Labs (all labs ordered are listed, but only abnormal results are displayed) Labs Reviewed  COMPREHENSIVE METABOLIC PANEL - Abnormal; Notable for the following components:      Result Value   Potassium 2.9 (*)    Glucose, Bld 112 (*)    BUN <5 (*)    All other components within normal limits  SALICYLATE LEVEL - Abnormal; Notable for the following components:   Salicylate Lvl <9.9 (*)    All other components within normal limits  ACETAMINOPHEN LEVEL - Abnormal; Notable for the following components:   Acetaminophen (Tylenol), Serum <10 (*)    All other components within normal limits  CBC - Abnormal; Notable for the following components:   Hemoglobin 9.3 (*)    HCT 30.8 (*)    MCV 77.4 (*)    MCH 23.4 (*)    RDW 19.9 (*)    All other components within normal limits  RAPID URINE DRUG SCREEN, HOSP PERFORMED - Abnormal; Notable for the following components:   Amphetamines POSITIVE (*)     Tetrahydrocannabinol POSITIVE (*)    All other components within normal limits  URINALYSIS, ROUTINE W REFLEX MICROSCOPIC - Abnormal; Notable for the following components:   Color, Urine AMBER (*)  APPearance HAZY (*)    Ketones, ur 20 (*)    Protein, ur 100 (*)    All other components within normal limits  SARS CORONAVIRUS 2 (TAT 6-24 HRS)  ETHANOL  MAGNESIUM  I-STAT BETA HCG BLOOD, ED ([facility], [facility], [facility] ONLY)    EKG EKG Interpretation  Date/Time:  Sunday April 05 2019 15:17:15 EST Ventricular Rate:  92 PR Interval:    QRS Duration: 88 QT Interval:  386 QTC Calculation: 478 R Axis:   78 Text Interpretation: Sinus rhythm Borderline prolonged QT interval similar to Jan 2020 Confirmed by Pricilla Loveless [phone]) on 04/05/2019 3:19:34 PM   Radiology No results found.  Procedures Procedures (including critical care time)  Medications Ordered in ED Medications  nicotine (NICODERM CQ - dosed in mg/24 hours) patch 21 mg (has no administration in time range)  alum & mag hydroxide-simeth (MAALOX/MYLANTA) 200-200-20 MG/5ML suspension 30 mL (has no administration in time range)  ondansetron (ZOFRAN) tablet 4 mg (has no administration in time range)  acetaminophen (TYLENOL) tablet 650 mg (has no administration in time range)  lamoTRIgine (LAMICTAL) tablet 25 mg (has no administration in time range)  levETIRAcetam (KEPPRA) tablet 500 mg (has no administration in time range)  potassium chloride SA (KLOR-CON) CR tablet 20 mEq (has no administration in time range)  levETIRAcetam (KEPPRA) tablet 500 mg (500 mg Oral Given 04/05/19 1531)  potassium chloride SA (KLOR-CON) CR tablet 40 mEq (40 mEq Oral Given 04/05/19 1531)    ED Course  I have reviewed the triage vital signs and the nursing notes.  Pertinent labs & imaging results that were available during my care of the patient were reviewed by me and considered in my medical decision making (see chart for details).    MDM  Rules/Calculators/A&P                      From a medical standpoint, the patient has some hypokalemia but this does not appear to be emergent and we can replace p.o.  Neuro exam is unremarkable.  She knows today is Valentine's Day and seems oriented.  Will have psychiatry consult on patient.  She otherwise does not appear to be acutely ill.  I do not think head CT is needed given she is not altered and has had these in the past. Final Clinical Impression(s) / ED Diagnoses Final diagnoses:  Hallucinations    Rx / DC Orders ED Discharge Orders    None       Pricilla Loveless, MD 04/05/19 1538

## 2019-04-05 NOTE — ED Triage Notes (Signed)
Pt reports auditory and visual hallucinations.  Mom just found out pt recently started having seizures again.  Hasn't taken seizure medications for several years.  Pt reports seeing rainbows and told her mom that the rapture was occurring.  Denies SI/HI.  Mom concerned that pt may be using other drugs other than marijuana.  Reports L arm sore today.  No known injury.

## 2019-04-05 NOTE — ED Notes (Signed)
TTS completed. Pt aware waiting for [facility] recommendation. Pt made phone call from phone at nurses' desk. Pt denies SI/HI. Pt denies AVH at this time. Pt voiced understanding of Medical Clearance Pt Policy - copy given.

## 2019-04-05 NOTE — BH Assessment (Signed)
Tele Assessment Note   Patient Name: Kelly Johns MRN: [id number] Referring Physician: Sherwood Gambler, MD Location of Patient: [facility] Location of Provider: Behavioral Health TTS Department  Kelly Johns is a 34 y.o. female who presented to [facility] on voluntary basis with complaint of hallucination and possible delusion.  Per report, Pt has a history of seizures and is not compliant with her seizure medication.  Pt lives in a motel in [place], and she is unemployed.  Pt is not followed by an outpatient provider.  Per report, Pt has experienced hallucinations since last Wednesday, as well as seizures.  Pt acknowledged that she experiences auditory and visual hallucinations -- voices, seeing railroad tracks, naked people walking around, and God.  Pt had no insight into the nature of hallucinations.  Pt's UDS was positive for THC and amphetamines, and she endorsed daily use of marijuana (''it keeps my feelings inside'') and Ecstasy.  Pt denied suicidal ideation, homicidal ideation, and self-injurious behavior.    During assessment, Pt presented as alert and oriented.  Pt was gowned and appeared appropriately groomed.  Pt's mood was ambivalent.  Affect was euthymic. Pt's speech was normal in rate, rhythm, and volume.  Thought processes were within normal range, and thought content was logical and goal-oriented.  Pt had little insight into the nature of her hallucinations.  Memory and concentration were fair. Insight, judgment, and impulse control were poor (as evidenced by ongoing substance use, poor insight into hallucination).  Consulted with Myrle Sheng, FNP, who determined that Pt shall remain in ED, sober, observe overnight, AM psych review.  Diagnosis: Substance-induced Psychosis  Past Medical History:  Past Medical History:  Diagnosis Date  . Seizures [facility])     Past Surgical History:  Procedure Laterality Date  . c-sec x 2  2005, 2006    Family History:  Family History  Problem  Relation Age of Onset  . Diabetes Maternal Grandmother     Social History:  reports that she has been smoking cigarettes. She has been smoking about 0.30 packs per day. She has never used smokeless tobacco. She reports current alcohol use. She reports current drug use. Frequency: 7.00 times per week. Drugs: Marijuana and MDMA (Ecstacy).  Additional Social History:  Alcohol / Drug Use Pain Medications: See MAR Prescriptions: See MAR Over the Counter: See MAR History of alcohol / drug use?: Yes Substance #1 Name of Substance 1: Marijuana 1 - Amount (size/oz): 2-3 blunts 1 - Frequency: Daily 1 - Duration: Ongoing 1 - Last Use / Amount: 04/05/2019 Substance #2 Name of Substance 2: Ecstasy 2 - Amount (size/oz): Varied 2 - Frequency: Weekly 2 - Duration: Ongoing 2 - Last Use / Amount: 04/05/2019 Substance #3 Name of Substance 3: Alcohol  CIWA: CIWA-Ar BP: 127/84 Pulse Rate: 66 Nausea and Vomiting: no nausea and no vomiting Tactile Disturbances: none Tremor: no tremor Auditory Disturbances: not present Paroxysmal Sweats: no sweat visible Visual Disturbances: not present Anxiety: no anxiety, at ease Headache, Fullness in Head: none present Agitation: normal activity Orientation and Clouding of Sensorium: cannot do serial additions or is uncertain about date CIWA-Ar Total: 1 COWS:    Allergies:  Allergies  Allergen Reactions  . Chocolate Hives    "breaks out in hives on back"    Home Medications: (Not in a hospital admission)   OB/GYN Status:  Patient's last menstrual period was 03/29/2019.  General Assessment Data Location of Assessment: [facility] ED TTS Assessment: In system Is this a Tele or Face-to-Face Assessment?: Tele  Assessment Is this an Initial Assessment or a Re-assessment for this encounter?: Initial Assessment Patient Accompanied by:: Parent(Mother brought Pt to hospital) Language Other than English: No Living Arrangements: Other (Comment)(Lives in a  motel) What gender do you identify as?: Female Marital status: Single Pregnancy Status: No Living Arrangements: Other (Comment)(Motel) Can pt return to current living arrangement?: Yes Admission Status: Voluntary Is patient capable of signing voluntary admission?: Yes Referral Source: Self/Family/Friend Insurance type: None     Crisis Care Plan Living Arrangements: Other (Comment)(Motel) Name of Psychiatrist: None Name of Therapist: None  Education Status Is patient currently in school?: No Is the patient employed, unemployed or receiving disability?: Unemployed  Risk to self with the past 6 months Suicidal Ideation: No Has patient been a risk to self within the past 6 months prior to admission? : No Suicidal Intent: No Has patient had any suicidal intent within the past 6 months prior to admission? : No Is patient at risk for suicide?: No Suicidal Plan?: No Has patient had any suicidal plan within the past 6 months prior to admission? : No Access to Means: No Previous Attempts/Gestures: No Intentional Self Injurious Behavior: None Family Suicide History: Unknown Recent stressful life event(s): Other (Comment), Recent negative physical changes(Seizures -- not taking medication) Persecutory voices/beliefs?: No Depression: No Substance abuse history and/or treatment for substance abuse?: No Suicide prevention information given to non-admitted patients: Not applicable  Risk to Others within the past 6 months Homicidal Ideation: No Does patient have any lifetime risk of violence toward others beyond the six months prior to admission? : No Thoughts of Harm to Others: No Current Homicidal Intent: No Current Homicidal Plan: No Access to Homicidal Means: No History of harm to others?: No Assessment of Violence: None Noted Criminal Charges Pending?: No Does patient have a court date: No Is patient on probation?: No  Psychosis Hallucinations: Auditory, Visual Delusions:  Unspecified  Mental Status Report Appearance/Hygiene: In scrubs, Unremarkable Eye Contact: Good Motor Activity: Freedom of movement, Unremarkable Speech: Logical/coherent Level of Consciousness: Alert Mood: Ambivalent Affect: Appropriate to circumstance Anxiety Level: Minimal Thought Processes: Relevant, Coherent Judgement: Partial Orientation: Person, Place, Time, Situation Obsessive Compulsive Thoughts/Behaviors: None  Cognitive Functioning Concentration: Normal Memory: Recent Intact, Remote Intact Is patient IDD: No Insight: Fair Impulse Control: Fair Appetite: Fair Have you had any weight changes? : No Change Sleep: No Change Total Hours of Sleep: 7 Vegetative Symptoms: None  ADLScreening [facility] Assessment Services) Patient's cognitive ability adequate to safely complete daily activities?: Yes Patient able to express need for assistance with ADLs?: Yes Independently performs ADLs?: Yes (appropriate for developmental age)  Prior Inpatient Therapy Prior Inpatient Therapy: No  Prior Outpatient Therapy Prior Outpatient Therapy: No Does patient have an ACCT team?: No Does patient have Intensive In-House Services?  : No Does patient have [facility] services? : No Does patient have P4CC services?: No  ADL Screening (condition at time of admission) Patient's cognitive ability adequate to safely complete daily activities?: Yes Is the patient deaf or have difficulty hearing?: No Does the patient have difficulty seeing, even when wearing glasses/contacts?: No Does the patient have difficulty concentrating, remembering, or making decisions?: No Patient able to express need for assistance with ADLs?: Yes Does the patient have difficulty dressing or bathing?: No Independently performs ADLs?: Yes (appropriate for developmental age) Does the patient have difficulty walking or climbing stairs?: No Weakness of Legs: None Weakness of Arms/Hands: None  Home Assistive  Devices/Equipment Home Assistive Devices/Equipment: None  Therapy Consults (therapy consults  require a physician order) PT Evaluation Needed: No OT Evalulation Needed: No SLP Evaluation Needed: No Abuse/Neglect Assessment (Assessment to be complete while patient is alone) Abuse/Neglect Assessment Can Be Completed: Yes Physical Abuse: Denies Verbal Abuse: Denies Sexual Abuse: Denies Exploitation of patient/patient's resources: Denies Self-Neglect: Denies Values / Beliefs Cultural Requests During Hospitalization: None Spiritual Requests During Hospitalization: None Consults Spiritual Care Consult Needed: No Transition of Care Team Consult Needed: No Advance Directives (For Healthcare) Does Patient Have a Medical Advance Directive?: No Would patient like information on creating a medical advance directive?: No - Patient declined          Disposition:  Disposition Initial Assessment Completed for this Encounter: Yes Patient referred to: Other (Comment)(Per T. Melvyn Neth, NP, Pt to remain overnight, sober, AM review)  This service was provided via telemedicine using a 2-way, interactive audio and video technology.  Names of all persons participating in this telemedicine service and their role in this encounter. Name: Floraine Buechler Role: Pt             Earline Mayotte 04/05/2019 5:57 PM

## 2019-04-06 DIAGNOSIS — R569 Unspecified convulsions: Secondary | ICD-10-CM

## 2019-04-06 DIAGNOSIS — R443 Hallucinations, unspecified: Secondary | ICD-10-CM

## 2019-04-06 DIAGNOSIS — F1910 Other psychoactive substance abuse, uncomplicated: Secondary | ICD-10-CM
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
## 2019-04-06 MED ORDER — LAMOTRIGINE 25 MG PO TABS: 50.0000 mg | ORAL_TABLET | Freq: Two times a day (BID) | ORAL | 0 refills | Status: AC

## 2019-04-06 MED ORDER — LEVETIRACETAM 500 MG PO TABS: 500.0000 mg | ORAL_TABLET | Freq: Two times a day (BID) | ORAL | 0 refills | Status: AC

## 2019-04-06 MED ORDER — LEVETIRACETAM 500 MG PO TABS
500.0000 mg | ORAL_TABLET | Freq: Two times a day (BID) | ORAL | Status: DC
  Administered 2019-04-06 (×2): 500 mg via ORAL
  Filled 2019-04-06 (×2): qty 1

## 2019-04-06 MED ORDER — POTASSIUM CHLORIDE CRYS ER 20 MEQ PO TBCR
40.0000 meq | EXTENDED_RELEASE_TABLET | Freq: Once | ORAL | Status: AC
  Administered 2019-04-06: 40 meq via ORAL
  Filled 2019-04-06: qty 2

## 2019-04-06 MED ORDER — LAMOTRIGINE 25 MG PO TABS
50.0000 mg | ORAL_TABLET | Freq: Two times a day (BID) | ORAL | Status: DC
  Administered 2019-04-06 (×2): 50 mg via ORAL
  Filled 2019-04-06 (×2): qty 2

## 2019-04-06 NOTE — Consult Note (Addendum)
[facility] Psych ED Discharge  04/06/2019 10:48 AM Kelly Johns  MRN:  [id number] Principal Problem: Seizure [facility]) Discharge Diagnoses: Principal Problem:   Seizure [facility]) Active Problems:   Substance abuse ([place])  Subjective: "I'm ready to go."  Patient seen and evaluated in person by this provider.  She reports she is ready to go and that she came to the emergency department due to seizures.  Minimizes her substance abuse and declines recommendation for rehab or outpatient services.  Denies suicidal ideations and past attempts along with homicidal ideations, hallucinations, and withdrawal symptoms.  Discussed the importance of taking her seizure medicines which she states causes her to sleep "too heavily".  Evidently when she had her seizure it could have been triggered by the use of meth and experienced some delusions and psychosis, none present on assessment.  It appears that she is clear what ever drug that she was using.  Psychiatrically stable at this time for discharge  Total Time spent with patient: 1 hour  Past Psychiatric History: Substance use  Past Medical History:  Past Medical History:  Diagnosis Date  . Seizures [facility])     Past Surgical History:  Procedure Laterality Date  . c-sec x 2  2005, 2006   Family History:  Family History  Problem Relation Age of Onset  . Diabetes Maternal Grandmother    Family Psychiatric  History: None Social History:  Social History   Substance and Sexual Activity  Alcohol Use Yes     Social History   Substance and Sexual Activity  Drug Use Yes  . Frequency: 7.0 times per week  . Types: Marijuana, MDMA (Ecstacy)   Comment: daily    Social History   Socioeconomic History  . Marital status: Single    Spouse name: Not on file  . Number of children: 2  . Years of education: Not on file  . Highest education level: Not on file  Occupational History  . Not on file  Tobacco Use  . Smoking status: Current Every Day Smoker   Packs/day: 0.30    Types: Cigarettes  . Smokeless tobacco: Never Used  . Tobacco comment: trying  Substance and Sexual Activity  . Alcohol use: Yes  . Drug use: Yes    Frequency: 7.0 times per week    Types: Marijuana, MDMA (Ecstacy)    Comment: daily  . Sexual activity: Yes    Birth control/protection: Condom  Other Topics Concern  . Not on file  Social History Narrative   Pt lives in a motel in [place].  She is unemployed ad not followed by an outpatient provider.   Social Determinants of Health   Financial Resource Strain:   . Difficulty of Paying Living Expenses: Not on file  Food Insecurity:   . Worried About Charity fundraiser in the Last Year: Not on file  . Ran Out of Food in the Last Year: Not on file  Transportation Needs:   . Lack of Transportation (Medical): Not on file  . Lack of Transportation (Non-Medical): Not on file  Physical Activity:   . Days of Exercise per Week: Not on file  . Minutes of Exercise per Session: Not on file  Stress:   . Feeling of Stress : Not on file  Social Connections:   . Frequency of Communication with Friends and Family: Not on file  . Frequency of Social Gatherings with Friends and Family: Not on file  . Attends Religious Services: Not on file  . Active  Member of Clubs or Organizations: Not on file  . Attends Banker Meetings: Not on file  . Marital Status: Not on file    Has this patient used any form of tobacco in the last 30 days? (Cigarettes, Smokeless Tobacco, Cigars, and/or Pipes) A prescription for an FDA-approved tobacco cessation medication was offered at discharge and the patient refused  Current Medications: Current Facility-Administered Medications  Medication Dose Route Frequency Provider Last Rate Last Admin  . acetaminophen (TYLENOL) tablet 650 mg  650 mg Oral Q4H PRN Pricilla Loveless, MD      . alum & mag hydroxide-simeth (MAALOX/MYLANTA) 200-200-20 MG/5ML suspension 30 mL  30 mL Oral Q6H PRN  Pricilla Loveless, MD      . lamoTRIgine (LAMICTAL) tablet 50 mg  50 mg Oral BID Ward, Kristen N, DO   50 mg at 04/06/19 1007  . levETIRAcetam (KEPPRA) tablet 500 mg  500 mg Oral BID Ward, Kristen N, DO   500 mg at 04/06/19 1007  . nicotine (NICODERM CQ - dosed in mg/24 hours) patch 21 mg  21 mg Transdermal Daily Pricilla Loveless, MD   Stopped at 04/05/19 1730  . ondansetron (ZOFRAN) tablet 4 mg  4 mg Oral Q8H PRN Pricilla Loveless, MD       Current Outpatient Medications  Medication Sig Dispense Refill  . acetaminophen (TYLENOL) 500 MG tablet Take 250 mg by mouth every 6 (six) hours as needed for headache (pain/cramps).    . lamoTRIgine (LAMICTAL) 25 MG tablet Take 1 tablet (25 mg total) by mouth 2 (two) times daily. (Patient not taking: Reported on 12/22/2015) 6 tablet 0  . lamoTRIgine (LAMICTAL) 25 MG tablet Take 1 tablet by mouth (25mg ) twice daily for 2 weeks then increase to 2 tablets by mouth twice daily and continue (Patient not taking: Reported on 04/05/2019) 120 tablet 0  . levETIRAcetam (KEPPRA) 500 MG tablet Take 1 tablet (500 mg total) by mouth 2 (two) times daily. (Patient not taking: Reported on 04/05/2019) 60 tablet 0  . levETIRAcetam (KEPPRA) 500 MG tablet Take 1 tablet (500 mg total) by mouth 2 (two) times daily. (Patient not taking: Reported on 04/05/2019) 30 tablet 0   PTA Medications: (Not in a hospital admission)   Musculoskeletal: Strength & Muscle Tone: within normal limits Gait & Station: normal Patient leans: N/A  Psychiatric Specialty Exam: Physical Exam Constitutional:      Appearance: Normal appearance. She is normal weight.  HENT:     Head: Normocephalic.     Nose: Nose normal.  Pulmonary:     Effort: Pulmonary effort is normal.  Musculoskeletal:        General: Normal range of motion.  Neurological:     General: No focal deficit present.     Mental Status: She is alert and oriented to person, place, and time.  Psychiatric:        Attention and Perception:  Attention and perception normal.        Mood and Affect: Affect normal. Mood is anxious.        Speech: Speech normal.        Behavior: Behavior normal. Behavior is cooperative.        Thought Content: Thought content normal.        Cognition and Memory: Cognition and memory normal.        Judgment: Judgment normal.     Review of Systems  Psychiatric/Behavioral: The patient is nervous/anxious.   All other systems reviewed and are negative.  Blood pressure 108/77, pulse 68, temperature 98.8 F (37.1 C), temperature source Oral, resp. rate 18, last menstrual period 03/29/2019, SpO2 97 %.There is no height or weight on file to calculate BMI.  General Appearance: Casual  Eye Contact:  Good  Speech:  Normal Rate  Volume:  Normal  Mood:  Anxious  Affect:  Congruent  Thought Process:  Coherent and Descriptions of Associations: Intact  Orientation:  Full (Time, Place, and Person)  Thought Content:  WDL and Logical  Suicidal Thoughts:  No  Homicidal Thoughts:  No  Memory:  Immediate;   Good Recent;   Good Remote;   Good  Judgement:  Fair  Insight:  Fair  Psychomotor Activity:  Normal  Concentration:  Concentration: Good and Attention Span: Good  Recall:  Good  Fund of Knowledge:  Good  Language:  Good  Akathisia:  No  Handed:  Right  AIMS (if indicated):     Assets:  Housing Leisure Time Physical Health Resilience Social Support  ADL's:  Intact  Cognition:  WNL  Sleep:        Demographic Factors:  NA  Loss Factors: NA  Historical Factors: NA  Risk Reduction Factors:   Sense of responsibility to family, Living with another person, especially a relative and Positive social support  Continued Clinical Symptoms:  Anxiety, mild  Cognitive Features That Contribute To Risk:  None    Suicide Risk:  Minimal: No identifiable suicidal ideation.  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

## 2019-04-06 NOTE — ED Notes (Signed)
Escorted pt to lobby. Pt on cell phone w/someone advising them "Yea, I've got everything". Pt declined to wait in room for ride to come.

## 2019-04-06 NOTE — ED Notes (Signed)
Patient did not eat her breakfast she is just ready to go home .she is calling her mom at this time

## 2019-04-06 NOTE — ED Notes (Signed)
Pt's mother called at approx 0820 this am. States she has not spoken w/pt yet this am - although pt reported she was calling her mother. Mother states pt may have called her fiance.

## 2019-04-06 NOTE — ED Notes (Addendum)
Pt questioning if she should take po meds prior to taking. Advised pt RN verified meds w/her mother when she called this am. Pt took meds then stated "Something doesn't feel right, do it?" Pt stated she called her mother on the phone this am when RN advised her that her mother had advised she had not called her. Pt asking repeatedly when she can be d/c'd to home. Advised pt waiting for [facility] NP - voiced understanding.

## 2019-04-06 NOTE — ED Provider Notes (Signed)
9:14 AM Reviewed patient chart.   Plan: Pending AM psych eval.   Meds: Home meds ordered.   Labs: K was low, this was repleted previously, x 2.  Hgb stable.   Dispo: Awaiting psych reccs.   BP 108/77 (BP Location: Right Arm)   Pulse 68   Temp 98.8 F (37.1 C) (Oral)   Resp 18   LMP 03/29/2019   SpO2 97%      Renne Crigler, PA-C 04/06/19 9539    Rolan Bucco, MD 04/06/19 1235

## 2019-04-06 NOTE — ED Notes (Signed)
Breakfast Ordered 

## 2019-04-06 NOTE — ED Notes (Addendum)
Rx x 2 given w/discharge instructions - questions answered to satisfaction. Voiced understanding of taking meds as prescribed. ALL belongings - 2 labeled belongings bags and 1 valuables envelope - returned to pt - Pt signed verifying all items present. RN encouraged pt to change into personal clothing. Pt appears to be texting on cell phone.

## 2019-04-06 NOTE — Discharge Instructions (Signed)
Seizure, Adult °A seizure is a sudden burst of abnormal electrical activity in the brain. Seizures usually last from 30 seconds to 2 minutes. They can cause many different symptoms. °Usually, seizures are not harmful unless they last a long time. °What are the causes? °Common causes of this condition include: °· Fever or infection. °· Conditions that affect the brain, such as: °? A brain abnormality that you were born with. °? A brain or head injury. °? Bleeding in the brain. °? A tumor. °? Stroke. °? Brain disorders such as autism or cerebral palsy. °· Low blood sugar. °· Conditions that are passed from parent to child (are inherited). °· Problems with substances, such as: °? Having a reaction to a drug or a medicine. °? Suddenly stopping the use of a substance (withdrawal). °In some cases, the cause may not be known. A person who has repeated seizures over time without a clear cause has a condition called epilepsy. °What increases the risk? °You are more likely to get this condition if you have: °· A family history of epilepsy. °· Had a seizure in the past. °· A brain disorder. °· A history of head injury, lack of oxygen at birth, or strokes. °What are the signs or symptoms? °There are many types of seizures. The symptoms vary depending on the type of seizure you have. Examples of symptoms during a seizure include: °· Shaking (convulsions). °· Stiffness in the body. °· Passing out (losing consciousness). °· Head nodding. °· Staring. °· Not responding to sound or touch. °· Loss of bladder control and bowel control. °Some people have symptoms right before and right after a seizure happens. °Symptoms before a seizure may include: °· Fear. °· Worry (anxiety). °· Feeling like you may vomit (nauseous). °· Feeling like the room is spinning (vertigo). °· Feeling like you saw or heard something before (déjà vu). °· Odd tastes or smells. °· Changes in how you see. You may see flashing lights or spots. °Symptoms after a  seizure happens can include: °· Confusion. °· Sleepiness. °· Headache. °· Weakness on one side of the body. °How is this treated? °Most seizures will stop on their own in under 5 minutes. In these cases, no treatment is needed. Seizures that last longer than 5 minutes will usually need treatment. Treatment can include: °· Medicines given through an IV tube. °· Avoiding things that are known to cause your seizures. These can include medicines that you take for another condition. °· Medicines to treat epilepsy. °· Surgery to stop the seizures. This may be needed if medicines do not help. °Follow these instructions at home: °Medicines °· Take over-the-counter and prescription medicines only as told by your doctor. °· Do not eat or drink anything that may keep your medicine from working, such as alcohol. °Activity °· Do not do any activities that would be dangerous if you had another seizure, like driving or swimming. Wait until your doctor says it is safe for you to do them. °· If you live in the [place] ask your local DMV (department of motor vehicles) when you can drive. °· Get plenty of rest. °Teaching others °Teach friends and family what to do when you have a seizure. They should: °· Lay you on the ground. °· Protect your head and body. °· Loosen any tight clothing around your neck. °· Turn you on your side. °· Not hold you down. °· Not put anything into your mouth. °· Know whether or not you need emergency care. °· Stay   with you until you are better. ° °General instructions °· Contact your doctor each time you have a seizure. °· Avoid anything that gives you seizures. °· Keep a seizure diary. Write down: °? What you think caused each seizure. °? What you remember about each seizure. °· Keep all follow-up visits as told by your doctor. This is important. °Contact a doctor if: °· You have another seizure. °· You have seizures more often. °· There is any change in what happens during your seizures. °· You keep having  seizures with treatment. °· You have symptoms of being sick or having an infection. °Get help right away if: °· You have a seizure that: °? Lasts longer than 5 minutes. °? Is different than seizures you had before. °? Makes it harder to breathe. °? Happens after you hurt your head. °· You have any of these symptoms after a seizure: °? Not being able to speak. °? Not being able to use a part of your body. °? Confusion. °? A bad headache. °· You have two or more seizures in a row. °· You do not wake up right after a seizure. °· You get hurt during a seizure. °These symptoms may be an emergency. Do not wait to see if the symptoms will go away. Get medical help right away. Call your local emergency services (911 in the [place] Do not drive yourself to the hospital. °Summary °· Seizures usually last from 30 seconds to 2 minutes. Usually, they are not harmful unless they last a long time. °· Do not eat or drink anything that may keep your medicine from working, such as alcohol. °· Teach friends and family what to do when you have a seizure. °· Contact your doctor each time you have a seizure. °This information is not intended to replace advice given to you by your health care provider. Make sure you discuss any questions you have with your health care provider. °Document Revised: 04/25/2018 Document Reviewed: 04/25/2018 °[employer] Patient Education © 2020 [employer] ° °

## 2019-04-07 ENCOUNTER — Ambulatory Visit: Payer: Medicaid Other | Admitting: Family Medicine

## 2019-09-20 DEATH — deceased

## 2021-01-18 ENCOUNTER — Other Ambulatory Visit: Payer: Self-pay | Admitting: Student

## 2021-01-18 DIAGNOSIS — G40909 Epilepsy, unspecified, not intractable, without status epilepticus: Secondary | ICD-10-CM
